# Patient Record
Sex: Male | Born: 1960 | Race: White | Hispanic: No | Marital: Married
Health system: Midwestern US, Academic
[De-identification: ages and names within clinical notes are randomized; demographics above are authoritative.]

---

## 2017-07-27 ENCOUNTER — Encounter: Admit: 2017-07-27 | Discharge: 2017-07-28 | Payer: MEDICARE

## 2017-07-28 ENCOUNTER — Emergency Department: Admit: 2017-07-28 | Discharge: 2017-07-28 | Payer: MEDICARE

## 2017-07-28 ENCOUNTER — Encounter: Admit: 2017-07-28 | Discharge: 2017-07-28 | Payer: MEDICARE

## 2017-07-28 ENCOUNTER — Ambulatory Visit: Admit: 2017-07-28 | Discharge: 2017-07-28 | Payer: MEDICARE

## 2017-07-28 DIAGNOSIS — Z794 Long term (current) use of insulin: ICD-10-CM

## 2017-07-28 DIAGNOSIS — S82891A Other fracture of right lower leg, initial encounter for closed fracture: Principal | ICD-10-CM

## 2017-07-28 DIAGNOSIS — E1140 Type 2 diabetes mellitus with diabetic neuropathy, unspecified: ICD-10-CM

## 2017-07-28 DIAGNOSIS — I10 Essential (primary) hypertension: ICD-10-CM

## 2017-07-28 DIAGNOSIS — G629 Polyneuropathy, unspecified: ICD-10-CM

## 2017-07-28 DIAGNOSIS — E119 Type 2 diabetes mellitus without complications: Principal | ICD-10-CM

## 2017-07-28 LAB — PTT (APTT): Lab: 29 s — ABNORMAL LOW (ref 24.0–36.5)

## 2017-07-28 LAB — COMPREHENSIVE METABOLIC PANEL
Lab: 0.7 mg/dL (ref 0.3–1.2)
Lab: 0.7 mg/dL (ref 0.4–1.24)
Lab: 10 mg/dL (ref 8.5–10.6)
Lab: 133 MMOL/L — ABNORMAL LOW (ref 137–147)
Lab: 14 K/UL — ABNORMAL HIGH (ref 3–12)
Lab: 16 U/L (ref 7–56)
Lab: 18 U/L (ref 7–40)
Lab: 183 U/L — ABNORMAL HIGH (ref 25–110)
Lab: 21 MMOL/L (ref 21–30)
Lab: 3.7 MMOL/L — ABNORMAL LOW (ref 3.5–5.1)
Lab: 32 mg/dL — ABNORMAL HIGH (ref 7–25)
Lab: 4.2 g/dL (ref 3.5–5.0)
Lab: 8.2 g/dL — ABNORMAL HIGH (ref 6.0–8.0)

## 2017-07-28 LAB — PROTIME INR (PT): Lab: 1.1 mg/dL — ABNORMAL HIGH (ref 0.8–1.2)

## 2017-07-28 LAB — CBC AND DIFF: Lab: 13 10*3/uL — ABNORMAL HIGH (ref 4.5–11.0)

## 2017-07-28 LAB — POC GLUCOSE
Lab: 245 mg/dL — ABNORMAL HIGH (ref 70–100)
Lab: 201 mg/dL — ABNORMAL HIGH (ref 70–100)

## 2017-07-28 MED ORDER — SUCCINYLCHOLINE CHLORIDE 20 MG/ML IJ SOLN
INTRAVENOUS | 0 refills | Status: DC
Start: 2017-07-28 — End: 2017-07-28
  Administered 2017-07-28: 17:00:00 120 mg via INTRAVENOUS

## 2017-07-28 MED ORDER — ONDANSETRON HCL (PF) 4 MG/2 ML IJ SOLN
INTRAVENOUS | 0 refills | Status: DC
Start: 2017-07-28 — End: 2017-07-28
  Administered 2017-07-28: 17:00:00 4 mg via INTRAVENOUS

## 2017-07-28 MED ORDER — PHENYLEPHRINE IN 0.9% NACL(PF) 1 MG/10 ML (100 MCG/ML) IV SYRG
INTRAVENOUS | 0 refills | Status: DC
Start: 2017-07-28 — End: 2017-07-28
  Administered 2017-07-28: 17:00:00 100 ug via INTRAVENOUS

## 2017-07-28 MED ORDER — PROMETHAZINE 25 MG/ML IJ SOLN
6.25 mg | INTRAVENOUS | 0 refills | Status: DC | PRN
Start: 2017-07-28 — End: 2017-07-28

## 2017-07-28 MED ORDER — ENOXAPARIN 40 MG/0.4 ML SC SYRG
40 mg | Freq: Every day | SUBCUTANEOUS | 0 refills | 7.00000 days | Status: AC
Start: 2017-07-28 — End: 2017-08-22
  Filled 2017-07-28 (×2): qty 8, 21d supply, fill #1

## 2017-07-28 MED ORDER — OXYCODONE 5 MG PO TAB
5-10 mg | ORAL_TABLET | ORAL | 0 refills | 6.00000 days | Status: AC | PRN
Start: 2017-07-28 — End: 2017-08-22
  Filled 2017-07-28 (×2): qty 40, 10d supply, fill #1

## 2017-07-28 MED ORDER — LIDOCAINE (PF) 200 MG/10 ML (2 %) IJ SYRG
0 refills | Status: DC
Start: 2017-07-28 — End: 2017-07-28
  Administered 2017-07-28: 17:00:00 100 mg via INTRAVENOUS

## 2017-07-28 MED ORDER — METOCLOPRAMIDE HCL 5 MG/ML IJ SOLN
10 mg | Freq: Once | INTRAVENOUS | 0 refills | Status: DC | PRN
Start: 2017-07-28 — End: 2017-07-28

## 2017-07-28 MED ORDER — CEFAZOLIN 1 GRAM IJ SOLR
0 refills | Status: DC
Start: 2017-07-28 — End: 2017-07-28
  Administered 2017-07-28: 17:00:00 2 g via INTRAVENOUS

## 2017-07-28 MED ORDER — OXYCODONE 5 MG PO TAB
5 mg | Freq: Once | ORAL | 0 refills | Status: CP
Start: 2017-07-28 — End: ?
  Administered 2017-07-28: 19:00:00 5 mg via ORAL

## 2017-07-28 MED ORDER — ATENOLOL 100 MG PO TAB
100 mg | Freq: Once | ORAL | 0 refills | Status: CP
Start: 2017-07-28 — End: ?
  Administered 2017-07-28: 17:00:00 100 mg via ORAL

## 2017-07-28 MED ORDER — DEXTRAN 70-HYPROMELLOSE (PF) 0.1-0.3 % OP DPET
0 refills | Status: DC
Start: 2017-07-28 — End: 2017-07-28
  Administered 2017-07-28: 17:00:00 2 [drp] via OPHTHALMIC

## 2017-07-28 MED ORDER — PROPOFOL INJ 10 MG/ML IV VIAL
0 refills | Status: DC
Start: 2017-07-28 — End: 2017-07-28
  Administered 2017-07-28: 17:00:00 200 mg via INTRAVENOUS

## 2017-07-28 MED ORDER — DEXAMETHASONE SODIUM PHOSPHATE 4 MG/ML IJ SOLN
INTRAVENOUS | 0 refills | Status: DC
Start: 2017-07-28 — End: 2017-07-28
  Administered 2017-07-28: 17:00:00 4 mg via INTRAVENOUS

## 2017-07-28 MED ORDER — ROCURONIUM 10 MG/ML IV SOLN
INTRAVENOUS | 0 refills | Status: DC
Start: 2017-07-28 — End: 2017-07-28
  Administered 2017-07-28: 17:00:00 30 mg via INTRAVENOUS

## 2017-07-28 MED ORDER — HYDROMORPHONE (PF) 2 MG/ML IJ SYRG
0 refills | Status: DC
Start: 2017-07-28 — End: 2017-07-28
  Administered 2017-07-28 (×2): 0.5 mg via INTRAVENOUS

## 2017-07-28 MED ORDER — MIDAZOLAM 1 MG/ML IJ SOLN
INTRAVENOUS | 0 refills | Status: DC
Start: 2017-07-28 — End: 2017-07-28
  Administered 2017-07-28: 17:00:00 2 mg via INTRAVENOUS

## 2017-07-28 MED ORDER — DIPHENHYDRAMINE HCL 50 MG/ML IJ SOLN
25 mg | Freq: Once | INTRAVENOUS | 0 refills | Status: DC | PRN
Start: 2017-07-28 — End: 2017-07-28

## 2017-07-28 MED ORDER — LACTATED RINGERS IV SOLP
1000 mL | INTRAVENOUS | 0 refills | Status: DC
Start: 2017-07-28 — End: 2017-07-28
  Administered 2017-07-28: 16:00:00 1000 mL via INTRAVENOUS

## 2017-07-28 NOTE — ED Notes
Pt belongings include:    Statisticianweatpants  Sweatshirt  Glasses    Belongings in labeled bag at bedside. Wife will keep wallet and cell phone. Declines the need to document.

## 2017-07-28 NOTE — ED Notes
Pt presents to the ED with his wife. Reports he fractured his left ankle on Thanksgiving after a fall, and had surgery. Now in a walking boot. Pt states that he continued to have right ankle pain as well. Had x-rays performed at an outside facility and arrives with splint to his right ankle. Told he has right ankle fracture and dislocation. Told to come in last night, but states he had already eaten, so figured they wouldn't do surgery. Presents this AM for ortho eval. States no pain while sitting. Has been ambulating on ankle at home, and rates pain 4/10 when he is up. Dr Gaylyn Cheerseinhardt currently at bedside seeing pt. Pt is alert and able to answer questions appropriately. Wife at bedside.

## 2017-07-28 NOTE — ED Notes
Pt remains in x-ray.

## 2017-07-28 NOTE — Other
Brief Operative Note    Name: Lance Thompson is a 56 y.o. male     DOB: 1961-07-27             MRN#: [id number]  DATE OF OPERATION: 07/28/2017    Date:  07/28/2017        Preoperative Dx:   Closed fracture of right ankle, initial encounter [S82.891A]    Post-op Diagnosis      * Closed fracture of right ankle, initial encounter [S82.891A]    Procedure(s) (LRB):  APPLICATION EXTERNAL FIXATION SYSTEM MULTIPLANE - LOWER EXTREMITY (Right)    Anesthesia Type: Defer to Anesthesia    Surgeon(s) and Role:     Mare Ferrari* Burton, Douglas, Lance Thompson - Primary     * Priscille LovelessStumpff, Kelly, Lance Thompson - Resident - Assisting     * Lance Burkitteinhardt, Lance Wik, Lance Thompson - Resident - Assisting      Findings:  Ankle fracture dislocation    Estimated Blood Loss: No blood loss documented.     Specimen(s) Removed/Disposition: * No specimens in log *    Complications:  None    Implants: see dictated note    Drains: None    Disposition:  PACU - stable    Lance Burkittaniel Alaijah Gibler, Lance Thompson  Pager

## 2017-07-28 NOTE — Anesthesia Post-Procedure Evaluation
Post-Anesthesia Evaluation    Name: Lance Thompson      MRN: [id number]     DOB: March 27, 1961     Age: 56 y.o.     Sex: male   __________________________________________________________________________     Procedure Date: 07/28/2017  Procedure: Procedure(s):  APPLICATION EXTERNAL FIXATION SYSTEM MULTIPLANE - LOWER EXTREMITY      Surgeon: Surgeon(s):  Priscille LovelessStumpff, Kelly, MD  Casey Burkitteinhardt, Daniel, MD  Mare FerrariBurton, Douglas, MD    Post-Anesthesia Vitals  BP: 128/70 (12/29 1315)  Temp: 36.6 C (97.9 F) (12/29 1315)  Pulse: 80 (12/29 1315)  Respirations: 11 PER MINUTE (12/29 1315)  SpO2: 95 % (12/29 1315)  O2 Delivery: None (Room Air) (12/29 1315)  SpO2 Pulse: 82 (12/29 1315)      Post Anesthesia Evaluation Note    Evaluation location: Pre/Post  Patient participation: recovered; patient participated in evaluation  Level of consciousness: alert    Pain score: 0  Pain management: adequate    Hydration: normovolemia  Temperature: 36.0C - 38.4C  Airway patency: adequate    Perioperative Events      Postoperative Status  Cardiovascular status: hemodynamically stable  Respiratory status: spontaneous ventilation        Perioperative Events  Perioperative Event: No  Emergency Case Activation: No

## 2017-07-28 NOTE — H&P (View-Only)
[facility] Note        Admission Date: 07/28/2017                                                  Chief Complaint/Reason for Consult: Right ankle fracture dislocation    Assessment/Plan   Lance Thompson is a 56 y.o. male with a right ankle fracture dislocation    Operative Intervention: Or today for external fixator application  WB status: Nonweightbearing right lower extremity  Antibiotics/tetanus: None from ortho standpoint  Pain Control: Oral  Keep n.p.o.  Hold all anticoagulation      Gaylyn Cheers  [id number]      ______________________________________________________________________    History of Present Illness: Lance Thompson is a 56 y.o. male   With diabetes and hypertension who presents with a approximately 57-week old ankle fracture dislocation.  Patient fell off a porch around Thanksgiving.  He was diagnosed and treated for a left ankle fracture to include surgery.  He had continued right ankle pain but was still ambulating on it.  This prompted x-rays from a local orthopedic surgeon yesterday and he was diagnosed with subacute ankle fracture dislocation.  Patient states that he has some pain with ambulating but none at rest.  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  He is insulin-dependent diabetic.  He has no history of myocardial infarction or stroke.  He has no history of chest pain.           No past medical history on file.  No past surgical history on file.    Social History  None tobacco use  None illicit drug use  None alcohol use    Family history  No pertinent family history  Family history is otherwise noncontributory for presenting problem    Allergies:  Fentanyl and Benicar [olmesartan]    No current outpatient medications on file as of 07/28/2017.         Review of Systems:  Pertinent positive findings: Pain to right lower extremity  Pertinent negative findings: Denies changes to baseline numbness  Otherwise a 10 point review of systems was negative. Vital Signs:  Last Filed in 24 hours   BP: 143/73 (12/29 0931)  Temp: 36.5 ???C (97.7 ???F) (12/29 1610)  Respirations: 18 PER MINUTE (12/29 0918)  SpO2: 97 % (12/29 0931)  SpO2 Pulse: 94 (12/29 0931)  Height: 175.3 cm (69) (12/29 9604)     Physical Exam:    Constitutional: No acute distress  Eyes: EOM grossly intact  ENT: Oropharynx/Nasopharynx clear  Respiratory: Unlabored respirations  Cardiovascular: Rate normal  Gastrointestinal: No obvious distention  Skin: Intact, swollen and unable to wrinkle skin of right lower extremity  Musculoskeletal: Flexes and extends great toe of right lower extremity  Neurologic: Sensation intact grossly to light touch throughout right lower extremity    Lab/Radiology/Other Diagnostic Tests:    Radiology: Reviewed, right ankle fracture dislocation    CBC w/Diff   Lab Results   Component Value Date/Time    WBC 13.9 (H) 07/28/2017 09:10 AM    HGB 12.5 (L) 07/28/2017 09:10 AM    HCT 37.3 (L) 07/28/2017 09:10 AM    PLTCT 417 (H) 07/28/2017 09:10 AM           Basic Metabolic Profile   Lab Results   Component Value Date/Time  NA 133 (L) 07/28/2017 09:10 AM    K 3.7 07/28/2017 09:10 AM    CL 98 07/28/2017 09:10 AM    CO2 21 07/28/2017 09:10 AM    GAP 14 (H) 07/28/2017 09:10 AM    BUN 32 (H) 07/28/2017 09:10 AM    CR 0.79 07/28/2017 09:10 AM    GLU 267 (H) 07/28/2017 09:10 AM        Coagulation Studies   Lab Results   Component Value Date/Time    PTT 29.2 07/28/2017 09:10 AM    INR 1.1 07/28/2017 09:10 AM            Casey Burkitt, MD  [phone]

## 2017-07-28 NOTE — ED Notes
Report given to Nicole, RN.

## 2017-07-29 NOTE — Operative Report(Direct Entry)
OPERATIVE REPORT    Name: PRASHANT GLOSSER is a 56 y.o. male     DOB: [birth year]             MRN#: [id number]    DATE OF OPERATION: 07/28/2017    Surgeon(s) and Role:     Mare Ferrari, MD - Primary     * Priscille Loveless, MD - Resident - Assisting     * Casey Burkitt, MD - Resident - Assisting        Preoperative Diagnosis:    Closed fracture of right ankle, initial encounter [S82.891A]    Post-op Diagnosis      * Closed fracture of right ankle, initial encounter [S82.891A]    Procedure(s) (LRB):  APPLICATION EXTERNAL FIXATION SYSTEM MULTIPLANE - LOWER EXTREMITY (Right)    Anesthesia Type: Defer to Anesthesia    Description and Findings of Operative Procedure:   Indications: Patient is a 56 year old male who has a history of bilateral ankle fractures that he sustained on Thanksgiving when he stepped off a porch.  His left lower extremity had previously been treated with operative fixation.  His right ankle fracture was only discovered in the previous days as he complained about continued pain.  X-rays were obtained that demonstrated a fracture dislocation of his ankle.  Patient had been ambulating on this side for over a month.  He presented to an outside provider and then was referred to [facility] for management of this injury.  He was counseled on treatment options and elected to proceed with surgical treatment.  He understood that this was a very of serious injury especially considering his diabetes and could result in a below-knee amputation if his wounds do not heal.  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  All questions were answered.    The patient was transported to the operative suite.  He was anesthetized and we prepped and draped in the usual sterile fashion.  A timeout was performed.  Imaging was brought in.  2 external fixator pins were placed in the midshaft of the anterior medial tibia.  These were placed with the use of a scalpel to incise skin.  Imaging confirmed that these pins were placed bicortically.  We then turned our attention to the calcaneus.  A single pin was placed through the calcaneus with the use of imaging.  The skin was incised with a knife and hemostat was used to spread subcutaneous and the neurovascular tissues out of the way prior to pin placement.  Traction and a reduction maneuver were then performed.  Imaging confirmed that the ankle alignment was significantly improved with just slight anterior subluxation of the talus on the tibia.  The external fixator bars were then applied and tightened in a delta configuration.  Wounds were dressed with Xeroform and Kling.  Patient was awoken from anesthesia.  He tolerated the procedure well.    Estimated Blood Loss: Minimal    Specimen(s) Removed/Disposition: * No specimens in log *    Attestation: I was present for the entire procedure.    Casey Burkitt, MD  Pager

## 2017-08-01 ENCOUNTER — Encounter: Admit: 2017-08-01 | Discharge: 2017-08-01 | Payer: MEDICARE

## 2017-08-01 DIAGNOSIS — G629 Polyneuropathy, unspecified: ICD-10-CM

## 2017-08-01 DIAGNOSIS — I10 Essential (primary) hypertension: ICD-10-CM

## 2017-08-01 DIAGNOSIS — E119 Type 2 diabetes mellitus without complications: Principal | ICD-10-CM

## 2017-08-03 ENCOUNTER — Encounter: Admit: 2017-08-03 | Discharge: 2017-08-03 | Payer: MEDICARE

## 2017-08-03 DIAGNOSIS — R52 Pain, unspecified: Principal | ICD-10-CM

## 2017-08-05 ENCOUNTER — Encounter: Admit: 2017-08-05 | Discharge: 2017-08-05 | Payer: MEDICARE

## 2017-08-05 ENCOUNTER — Encounter: Admit: 2017-08-05 | Discharge: 2017-08-06 | Payer: MEDICARE

## 2017-08-05 DIAGNOSIS — M86271 Subacute osteomyelitis, right ankle and foot: ICD-10-CM

## 2017-08-05 DIAGNOSIS — R69 Illness, unspecified: ICD-10-CM

## 2017-08-05 DIAGNOSIS — T847XXA Infection and inflammatory reaction due to other internal orthopedic prosthetic devices, implants and grafts, initial encounter: Principal | ICD-10-CM

## 2017-08-05 DIAGNOSIS — T847XXD Infection and inflammatory reaction due to other internal orthopedic prosthetic devices, implants and grafts, subsequent encounter: ICD-10-CM

## 2017-08-05 MED ORDER — SODIUM CHLORIDE 0.9 % IV SOLP
INTRAVENOUS | 0 refills | Status: DC
Start: 2017-08-05 — End: 2017-08-07
  Administered 2017-08-06 – 2017-08-07 (×2): 1000.000 mL via INTRAVENOUS

## 2017-08-05 MED ORDER — VANCOMYCIN 1,500 MG IVPB
1500 mg | Freq: Two times a day (BID) | INTRAVENOUS | 0 refills | Status: DC
Start: 2017-08-05 — End: 2017-08-07
  Administered 2017-08-06 – 2017-08-07 (×8): 1500 mg via INTRAVENOUS

## 2017-08-05 MED ORDER — VANCOMYCIN PHARMACY TO MANAGE
1 | 0 refills | Status: DC
Start: 2017-08-05 — End: 2017-08-07

## 2017-08-05 MED ORDER — MAGNESIUM HYDROXIDE 2,400 MG/10 ML PO SUSP
10 mL | Freq: Every day | ORAL | 0 refills | Status: DC
Start: 2017-08-05 — End: 2017-08-22
  Administered 2017-08-07 – 2017-08-22 (×6): 10 mL via ORAL

## 2017-08-05 MED ORDER — ENOXAPARIN 40 MG/0.4 ML SC SYRG
40 mg | Freq: Every day | SUBCUTANEOUS | 0 refills | Status: DC
Start: 2017-08-05 — End: 2017-08-22
  Administered 2017-08-07 – 2017-08-22 (×15): 40 mg via SUBCUTANEOUS

## 2017-08-05 MED ORDER — VANCOMYCIN IVPB
15 mg/kg | INTRAVENOUS | 0 refills | Status: DC
Start: 2017-08-05 — End: 2017-08-06

## 2017-08-05 MED ORDER — OXYCODONE 5 MG PO TAB
5-10 mg | ORAL | 0 refills | Status: DC | PRN
Start: 2017-08-05 — End: 2017-08-22
  Administered 2017-08-06 – 2017-08-15 (×17): 10 mg via ORAL
  Administered 2017-08-15: 5 mg via ORAL
  Administered 2017-08-16: 04:00:00 10 mg via ORAL
  Administered 2017-08-17: 05:00:00 5 mg via ORAL
  Administered 2017-08-17: 16:00:00 10 mg via ORAL
  Administered 2017-08-17: 03:00:00 5 mg via ORAL
  Administered 2017-08-18 – 2017-08-20 (×5): 10 mg via ORAL
  Administered 2017-08-20 (×2): 5 mg via ORAL
  Administered 2017-08-21: 04:00:00 10 mg via ORAL

## 2017-08-05 MED ORDER — PIPERACILLIN/TAZOBACTAM 3.375 G/NS IVPB (MB+)
3.375 g | INTRAVENOUS | 0 refills | Status: DC
Start: 2017-08-05 — End: 2017-08-06

## 2017-08-05 MED ORDER — BISACODYL 10 MG RE SUPP
10 mg | Freq: Every day | RECTAL | 0 refills | Status: DC | PRN
Start: 2017-08-05 — End: 2017-08-21
  Administered 2017-08-06: 06:00:00 10 mg via RECTAL

## 2017-08-05 MED ORDER — PIPERACILLIN/TAZOBACTAM 3.375 G/NS IVPB (MB+)
3.375 g | INTRAVENOUS | 0 refills | Status: DC
Start: 2017-08-05 — End: 2017-08-15
  Administered 2017-08-06 – 2017-08-15 (×73): 3.375 g via INTRAVENOUS

## 2017-08-05 MED ORDER — ONDANSETRON HCL (PF) 4 MG/2 ML IJ SOLN
4 mg | INTRAVENOUS | 0 refills | Status: DC | PRN
Start: 2017-08-05 — End: 2017-08-22
  Administered 2017-08-06 – 2017-08-21 (×4): 4 mg via INTRAVENOUS

## 2017-08-05 MED ORDER — POTASSIUM CHLORIDE 20 MEQ PO TBTQ
60 meq | Freq: Once | ORAL | 0 refills | Status: CP
Start: 2017-08-05 — End: ?
  Administered 2017-08-06: 05:00:00 60 meq via ORAL

## 2017-08-05 MED ORDER — INSULIN ASPART 100 UNIT/ML SC FLEXPEN
0-7 [IU] | Freq: Before meals | SUBCUTANEOUS | 0 refills | Status: DC
Start: 2017-08-05 — End: 2017-08-14
  Administered 2017-08-06: 19:00:00 2 [IU] via SUBCUTANEOUS
  Administered 2017-08-06: 14:00:00 1 [IU] via SUBCUTANEOUS

## 2017-08-05 MED ORDER — VANCOMYCIN 1,500 MG IVPB
1500 mg | Freq: Once | INTRAVENOUS | 0 refills | Status: DC
Start: 2017-08-05 — End: 2017-08-06

## 2017-08-06 ENCOUNTER — Encounter: Admit: 2017-08-06 | Discharge: 2017-08-06 | Payer: MEDICARE

## 2017-08-06 ENCOUNTER — Inpatient Hospital Stay
Admit: 2017-08-06 | Discharge: 2017-08-22 | Disposition: A | Discharge status: Skilled Nursing Facility | Payer: MEDICARE | Source: Other Acute Inpatient Hospital | Admitting: Hand Surgery

## 2017-08-06 DIAGNOSIS — E119 Type 2 diabetes mellitus without complications: Principal | ICD-10-CM

## 2017-08-06 DIAGNOSIS — T847XXA Infection and inflammatory reaction due to other internal orthopedic prosthetic devices, implants and grafts, initial encounter: Principal | ICD-10-CM

## 2017-08-06 DIAGNOSIS — G629 Polyneuropathy, unspecified: ICD-10-CM

## 2017-08-06 DIAGNOSIS — I10 Essential (primary) hypertension: ICD-10-CM

## 2017-08-06 LAB — POC GLUCOSE
Lab: 99 mg/dL — ABNORMAL LOW (ref 70–100)
Lab: 99 mg/dL (ref 70–100)
Lab: 161 mg/dL — ABNORMAL HIGH (ref 70–100)
Lab: 225 mg/dL — ABNORMAL HIGH (ref 70–100)
Lab: 167 mg/dL — ABNORMAL HIGH (ref 70–100)
Lab: 166 mg/dL — ABNORMAL HIGH (ref 70–100)

## 2017-08-06 LAB — BASIC METABOLIC PANEL
Lab: 0.9 mg/dL (ref 0.4–1.24)
Lab: 10 pg (ref 3–12)
Lab: 103 MMOL/L — ABNORMAL LOW (ref 98–110)
Lab: 133 MMOL/L — ABNORMAL LOW (ref 137–147)
Lab: 20 MMOL/L — ABNORMAL LOW (ref 21–30)
Lab: 3 MMOL/L — ABNORMAL LOW (ref 3.5–5.1)
Lab: 31 mg/dL — ABNORMAL HIGH (ref 7–25)
Lab: 8.9 mg/dL (ref 8.5–10.6)
Lab: 96 mg/dL (ref 70–100)
Lab: >60 mL/min (ref >60)
Lab: 134 MMOL/L — ABNORMAL LOW (ref >60)
Lab: 135 MMOL/L — ABNORMAL LOW (ref 137–147)

## 2017-08-06 LAB — GRAM STAIN

## 2017-08-06 LAB — LACTIC ACID (BG - RAPID LACTATE): Lab: 1.1 MMOL/L (ref 0.5–2.0)

## 2017-08-06 LAB — CBC AND DIFF
Lab: 19 10*3/uL — ABNORMAL HIGH (ref 4.5–11.0)
Lab: 17 K/UL — ABNORMAL HIGH (ref 4.5–11.0)
Lab: 9.4 g/dL — ABNORMAL LOW (ref >60)

## 2017-08-06 LAB — CBC
Lab: 17 10*3/uL — ABNORMAL HIGH (ref 4.5–11.0)
Lab: 3.4 M/UL — ABNORMAL LOW (ref 4.4–5.5)

## 2017-08-06 MED ORDER — LIDOCAINE (PF) 10 MG/ML (1 %) IJ SOLN
.1-2 mL | INTRAMUSCULAR | 0 refills | Status: DC | PRN
Start: 2017-08-06 — End: 2017-08-06

## 2017-08-06 MED ORDER — MORPHINE 2 MG/ML IV CRTG
1 mg | INTRAVENOUS | 0 refills | Status: DC | PRN
Start: 2017-08-06 — End: 2017-08-06

## 2017-08-06 MED ORDER — LACTATED RINGERS IV SOLP
1000 mL | INTRAVENOUS | 0 refills | Status: DC
Start: 2017-08-06 — End: 2017-08-06
  Administered 2017-08-06: 19:00:00 1000.000 mL via INTRAVENOUS

## 2017-08-06 MED ORDER — ATENOLOL 100 MG PO TAB
100 mg | Freq: Every day | ORAL | 0 refills | Status: CP
Start: 2017-08-06 — End: ?
  Administered 2017-08-06: 18:00:00 100 mg via ORAL

## 2017-08-06 MED ORDER — METOCLOPRAMIDE HCL 5 MG/ML IJ SOLN
10 mg | Freq: Once | INTRAVENOUS | 0 refills | Status: DC | PRN
Start: 2017-08-06 — End: 2017-08-06

## 2017-08-06 MED ORDER — LIDOCAINE (PF) 200 MG/10 ML (2 %) IJ SYRG
0 refills | Status: DC
Start: 2017-08-06 — End: 2017-08-06
  Administered 2017-08-06: 20:00:00 80 mg via INTRAVENOUS

## 2017-08-06 MED ORDER — GABAPENTIN 300 MG PO CAP
300 mg | Freq: Once | ORAL | 0 refills | Status: CP
Start: 2017-08-06 — End: ?
  Administered 2017-08-06: 18:00:00 300 mg via ORAL

## 2017-08-06 MED ORDER — HYDROMORPHONE 2 MG/ML IJ SOLN
0 refills | Status: DC
Start: 2017-08-06 — End: 2017-08-06
  Administered 2017-08-06 (×4): 0.5 mg via INTRAVENOUS

## 2017-08-06 MED ORDER — ONDANSETRON HCL (PF) 4 MG/2 ML IJ SOLN
INTRAVENOUS | 0 refills | Status: DC
Start: 2017-08-06 — End: 2017-08-06
  Administered 2017-08-06: 20:00:00 4 mg via INTRAVENOUS

## 2017-08-06 MED ORDER — DEXTRAN 70-HYPROMELLOSE (PF) 0.1-0.3 % OP DPET
0 refills | Status: DC
Start: 2017-08-06 — End: 2017-08-06
  Administered 2017-08-06: 20:00:00 2 [drp] via OPHTHALMIC

## 2017-08-06 MED ORDER — DIPHENHYDRAMINE HCL 50 MG/ML IJ SOLN
25 mg | Freq: Once | INTRAVENOUS | 0 refills | Status: DC | PRN
Start: 2017-08-06 — End: 2017-08-06

## 2017-08-06 MED ORDER — ACETAMINOPHEN 500 MG PO TAB
1000 mg | Freq: Once | ORAL | 0 refills | Status: CP
Start: 2017-08-06 — End: ?
  Administered 2017-08-06: 18:00:00 1000 mg via ORAL

## 2017-08-06 MED ORDER — PHENYLEPHRINE IN 0.9% NACL(PF) 1 MG/10 ML (100 MCG/ML) IV SYRG
INTRAVENOUS | 0 refills | Status: DC
Start: 2017-08-06 — End: 2017-08-06
  Administered 2017-08-06 (×4): 100 ug via INTRAVENOUS

## 2017-08-06 MED ORDER — PROMETHAZINE 25 MG/ML IJ SOLN
6.25 mg | INTRAVENOUS | 0 refills | Status: DC | PRN
Start: 2017-08-06 — End: 2017-08-06

## 2017-08-06 MED ORDER — HYDROMORPHONE (PF) 2 MG/ML IJ SYRG
.5-1 mg | INTRAVENOUS | 0 refills | Status: DC | PRN
Start: 2017-08-06 — End: 2017-08-06

## 2017-08-06 MED ORDER — SODIUM CHLORIDE 0.9 % IR SOLN
0 refills | Status: DC
Start: 2017-08-06 — End: 2017-08-06
  Administered 2017-08-06: 20:00:00 1000 mL

## 2017-08-06 MED ORDER — PROPOFOL INJ 10 MG/ML IV VIAL
0 refills | Status: DC
Start: 2017-08-06 — End: 2017-08-06
  Administered 2017-08-06: 20:00:00 170 mg via INTRAVENOUS

## 2017-08-06 MED ORDER — MORPHINE 2 MG/ML IV CRTG
2 mg | INTRAVENOUS | 0 refills | Status: DC | PRN
Start: 2017-08-06 — End: 2017-08-06

## 2017-08-07 ENCOUNTER — Encounter: Admit: 2017-08-07 | Discharge: 2017-08-07 | Payer: MEDICARE

## 2017-08-07 DIAGNOSIS — E119 Type 2 diabetes mellitus without complications: Principal | ICD-10-CM

## 2017-08-07 DIAGNOSIS — I10 Essential (primary) hypertension: ICD-10-CM

## 2017-08-07 DIAGNOSIS — G629 Polyneuropathy, unspecified: ICD-10-CM

## 2017-08-07 LAB — POC GLUCOSE
Lab: 176 mg/dL — ABNORMAL HIGH (ref 70–100)
Lab: 177 mg/dL — ABNORMAL HIGH (ref 70–100)
Lab: 198 mg/dL — ABNORMAL HIGH (ref 70–100)
Lab: 222 mg/dL — ABNORMAL HIGH (ref 70–100)
Lab: 271 mg/dL — ABNORMAL HIGH (ref 70–100)

## 2017-08-07 LAB — CBC AND DIFF: Lab: 19 K/UL — ABNORMAL HIGH (ref 4.5–11.0)

## 2017-08-07 LAB — VANCOMYCIN TROUGH: Lab: 19 ug/mL — ABNORMAL HIGH (ref 10.0–20.0)

## 2017-08-07 LAB — BASIC METABOLIC PANEL: Lab: 126 MMOL/L — ABNORMAL LOW (ref >60)

## 2017-08-07 MED ORDER — INSULIN GLARGINE 100 UNIT/ML (3 ML) SC INJ PEN
30 [IU] | Freq: Two times a day (BID) | SUBCUTANEOUS | 0 refills | Status: DC
Start: 2017-08-07 — End: 2017-08-22
  Administered 2017-08-08 – 2017-08-19 (×3): 30 [IU] via SUBCUTANEOUS

## 2017-08-07 MED ORDER — VENLAFAXINE 37.5 MG PO TAB
37.5 mg | Freq: Two times a day (BID) | ORAL | 0 refills | Status: DC
Start: 2017-08-07 — End: 2017-08-22
  Administered 2017-08-07 – 2017-08-22 (×28): 37.5 mg via ORAL

## 2017-08-08 ENCOUNTER — Encounter: Admit: 2017-08-08 | Discharge: 2017-08-08 | Payer: MEDICARE

## 2017-08-08 DIAGNOSIS — I10 Essential (primary) hypertension: ICD-10-CM

## 2017-08-08 DIAGNOSIS — G629 Polyneuropathy, unspecified: ICD-10-CM

## 2017-08-08 DIAGNOSIS — E119 Type 2 diabetes mellitus without complications: Principal | ICD-10-CM

## 2017-08-08 LAB — CBC AND DIFF: Lab: 16 K/UL — ABNORMAL HIGH (ref 4.5–11.0)

## 2017-08-08 LAB — BASIC METABOLIC PANEL
Lab: 130 MMOL/L — ABNORMAL LOW (ref 137–147)
Lab: 244 mg/dL — ABNORMAL HIGH (ref 70–100)
Lab: 26 MMOL/L (ref 21–30)
Lab: 3.4 MMOL/L — ABNORMAL LOW (ref 3.5–5.1)
Lab: 97 MMOL/L — ABNORMAL LOW (ref 98–110)
Lab: 7 (ref 3–12)
Lab: 133 MMOL/L — ABNORMAL LOW (ref 137–147)

## 2017-08-08 LAB — POC GLUCOSE
Lab: 273 mg/dL — ABNORMAL HIGH (ref 70–100)
Lab: 84 mg/dL (ref 70–100)
Lab: 88 mg/dL (ref 70–100)

## 2017-08-08 LAB — OSMOLALITY: Lab: 282 mosm/kg — ABNORMAL LOW (ref 280–307)

## 2017-08-08 LAB — UREA NITROGEN-URINE RANDOM: Lab: 486 mg/dL

## 2017-08-08 LAB — CREATININE-URINE RANDOM: Lab: 48 mg/dL

## 2017-08-08 LAB — OSMOLALITY-URINE RANDOM: Lab: 384 mosm/kg (ref 50–1400)

## 2017-08-08 LAB — SODIUM-URINE RANDOM: Lab: 60 MMOL/L

## 2017-08-08 MED ORDER — MORPHINE 2 MG/ML IV CRTG
1-2 mg | Freq: Once | INTRAVENOUS | 0 refills | Status: AC
Start: 2017-08-08 — End: ?

## 2017-08-08 MED ORDER — POTASSIUM CHLORIDE 20 MEQ PO TBTQ
40 meq | Freq: Once | ORAL | 0 refills | Status: CP
Start: 2017-08-08 — End: ?
  Administered 2017-08-08: 17:00:00 40 meq via ORAL

## 2017-08-08 MED ADMIN — SODIUM CHLORIDE 0.9 % IV SOLP [27838]: INTRAVENOUS | @ 22:00:00 | Stop: 2017-08-08 | NDC 00338004904

## 2017-08-09 ENCOUNTER — Encounter: Admit: 2017-08-09 | Discharge: 2017-08-09 | Payer: MEDICARE

## 2017-08-09 ENCOUNTER — Inpatient Hospital Stay: Admit: 2017-08-09 | Discharge: 2017-08-09 | Payer: MEDICARE

## 2017-08-09 DIAGNOSIS — T847XXA Infection and inflammatory reaction due to other internal orthopedic prosthetic devices, implants and grafts, initial encounter: Principal | ICD-10-CM

## 2017-08-09 LAB — BASIC METABOLIC PANEL: Lab: 132 MMOL/L — ABNORMAL LOW (ref >60)

## 2017-08-09 LAB — POC GLUCOSE
Lab: 106 mg/dL — ABNORMAL HIGH (ref 70–100)
Lab: 144 mg/dL — ABNORMAL HIGH (ref 70–100)
Lab: 220 mg/dL — ABNORMAL HIGH (ref >60)
Lab: 221 mg/dL — ABNORMAL HIGH (ref 70–100)

## 2017-08-09 LAB — CULTURE-WOUND/TISSUE/FLUID(AEROBIC ONLY)W/SENSITIVITY

## 2017-08-09 LAB — CBC AND DIFF: Lab: 18 K/UL — ABNORMAL HIGH (ref 4.5–11.0)

## 2017-08-10 ENCOUNTER — Inpatient Hospital Stay: Admit: 2017-08-10 | Discharge: 2017-08-10 | Payer: MEDICARE

## 2017-08-10 ENCOUNTER — Encounter: Admit: 2017-08-10 | Discharge: 2017-08-10 | Payer: MEDICARE

## 2017-08-10 ENCOUNTER — Encounter: Admit: 2017-08-06 | Discharge: 2017-08-06 | Payer: MEDICARE

## 2017-08-10 DIAGNOSIS — G629 Polyneuropathy, unspecified: ICD-10-CM

## 2017-08-10 DIAGNOSIS — E119 Type 2 diabetes mellitus without complications: Principal | ICD-10-CM

## 2017-08-10 DIAGNOSIS — I10 Essential (primary) hypertension: ICD-10-CM

## 2017-08-10 LAB — CBC
Lab: 15 10*3/uL — ABNORMAL HIGH (ref 4.5–11.0)
Lab: 3.6 M/UL — ABNORMAL LOW (ref 4.4–5.5)
Lab: 550 10*3/uL — ABNORMAL HIGH (ref >60)
Lab: 7.6 FL (ref >60)

## 2017-08-10 LAB — POC GLUCOSE
Lab: 181 mg/dL — ABNORMAL HIGH (ref 70–100)
Lab: 84 mg/dL (ref 70–100)
Lab: 72 mg/dL (ref 70–100)
Lab: 110 mg/dL — ABNORMAL HIGH (ref 70–100)
Lab: 106 mg/dL — ABNORMAL HIGH (ref 70–100)
Lab: 120 mg/dL — ABNORMAL HIGH (ref 70–100)

## 2017-08-10 LAB — GRAM STAIN

## 2017-08-10 LAB — BASIC METABOLIC PANEL: Lab: 133 MMOL/L — ABNORMAL LOW (ref 137–147)

## 2017-08-10 MED ORDER — MORPHINE 2 MG/ML IV CRTG
1-2 mg | INTRAVENOUS | 0 refills | Status: DC | PRN
Start: 2017-08-10 — End: 2017-08-22
  Administered 2017-08-10: 21:00:00 2 mg via INTRAVENOUS

## 2017-08-10 MED ORDER — LIDOCAINE (PF) 200 MG/10 ML (2 %) IJ SYRG
0 refills | Status: DC
Start: 2017-08-10 — End: 2017-08-10
  Administered 2017-08-10: 17:00:00 100 mg via INTRAVENOUS

## 2017-08-10 MED ORDER — HYDROMORPHONE (PF) 2 MG/ML IJ SYRG
.5 mg | INTRAVENOUS | 0 refills | Status: DC | PRN
Start: 2017-08-10 — End: 2017-08-10
  Administered 2017-08-10: 20:00:00 0.5 mg via INTRAVENOUS

## 2017-08-10 MED ORDER — DEXTROSE 50 % IN WATER (D50W) IV SOLP
25 mL | Freq: Once | INTRAVENOUS | 0 refills | Status: CP
Start: 2017-08-10 — End: ?
  Administered 2017-08-10: 17:00:00 25 mL via INTRAVENOUS

## 2017-08-10 MED ORDER — VANCOMYCIN 1,000 MG IV SOLR
0 refills | Status: DC
Start: 2017-08-10 — End: 2017-08-10
  Administered 2017-08-10: 19:00:00 1 g via TOPICAL

## 2017-08-10 MED ORDER — ACETAMINOPHEN 500 MG PO TAB
1000 mg | Freq: Once | ORAL | 0 refills | Status: CP
Start: 2017-08-10 — End: ?
  Administered 2017-08-10: 20:00:00 1000 mg via ORAL

## 2017-08-10 MED ORDER — ONDANSETRON HCL (PF) 4 MG/2 ML IJ SOLN
INTRAVENOUS | 0 refills | Status: DC
Start: 2017-08-10 — End: 2017-08-10
  Administered 2017-08-10: 18:00:00 4 mg via INTRAVENOUS

## 2017-08-10 MED ORDER — MIDAZOLAM 1 MG/ML IJ SOLN
INTRAVENOUS | 0 refills | Status: DC
Start: 2017-08-10 — End: 2017-08-10
  Administered 2017-08-10: 17:00:00 2 mg via INTRAVENOUS

## 2017-08-10 MED ORDER — LACTATED RINGERS IV SOLP
INTRAVENOUS | 0 refills | Status: DC
Start: 2017-08-10 — End: 2017-08-12

## 2017-08-10 MED ORDER — LACTATED RINGERS IV SOLP
0 refills | Status: DC
Start: 2017-08-10 — End: 2017-08-10
  Administered 2017-08-10 (×2): via INTRAVENOUS

## 2017-08-10 MED ORDER — FENTANYL CITRATE (PF) 50 MCG/ML IJ SOLN
0 refills | Status: DC
Start: 2017-08-10 — End: 2017-08-10
  Administered 2017-08-10: 17:00:00 100 ug via INTRAVENOUS

## 2017-08-10 MED ORDER — ROCURONIUM 10 MG/ML IV SOLN
INTRAVENOUS | 0 refills | Status: DC
Start: 2017-08-10 — End: 2017-08-10
  Administered 2017-08-10: 17:00:00 50 mg via INTRAVENOUS

## 2017-08-10 MED ORDER — HALOPERIDOL LACTATE 5 MG/ML IJ SOLN
0 refills | Status: DC
Start: 2017-08-10 — End: 2017-08-10
  Administered 2017-08-10: 18:00:00 1 mg via INTRAVENOUS

## 2017-08-10 MED ORDER — PROPOFOL INJ 10 MG/ML IV VIAL
0 refills | Status: DC
Start: 2017-08-10 — End: 2017-08-10
  Administered 2017-08-10: 17:00:00 200 mg via INTRAVENOUS

## 2017-08-10 MED ORDER — SUGAMMADEX 100 MG/ML IV SOLN
INTRAVENOUS | 0 refills | Status: DC
Start: 2017-08-10 — End: 2017-08-10
  Administered 2017-08-10: 19:00:00 200 mg via INTRAVENOUS

## 2017-08-10 MED ORDER — CEFAZOLIN 1 GRAM IJ SOLR
0 refills | Status: DC
Start: 2017-08-10 — End: 2017-08-10
  Administered 2017-08-10: 18:00:00 2 g via INTRAVENOUS

## 2017-08-10 MED ORDER — PHENYLEPHRINE IN 0.9% NACL(PF) 1 MG/10 ML (100 MCG/ML) IV SYRG
INTRAVENOUS | 0 refills | Status: DC
Start: 2017-08-10 — End: 2017-08-10
  Administered 2017-08-10 (×6): 100 ug via INTRAVENOUS

## 2017-08-10 MED ADMIN — SODIUM CHLORIDE 0.9 % IV SOLP [27838]: 1000 mL | INTRAVENOUS | @ 21:00:00 | Stop: 2017-08-10 | NDC 00338004904

## 2017-08-10 MED ADMIN — LACTATED RINGERS IV SOLP [4318]: INTRAVENOUS | @ 16:00:00 | Stop: 2017-08-10 | NDC 00338011704

## 2017-08-11 LAB — CULTURE-BLOOD W/SENSITIVITY

## 2017-08-11 LAB — CULTURE-ANAEROBIC

## 2017-08-11 LAB — BASIC METABOLIC PANEL: Lab: 137 MMOL/L — ABNORMAL LOW (ref >60)

## 2017-08-11 LAB — POC GLUCOSE
Lab: 141 mg/dL — ABNORMAL HIGH (ref 70–100)
Lab: 143 mg/dL — ABNORMAL HIGH (ref 70–100)
Lab: 149 mg/dL — ABNORMAL HIGH (ref 70–100)
Lab: 84 mg/dL (ref 70–100)

## 2017-08-11 LAB — CBC: Lab: 16 K/UL — ABNORMAL HIGH (ref 4.5–11.0)

## 2017-08-12 LAB — POC GLUCOSE
Lab: 203 mg/dL — ABNORMAL HIGH (ref 70–100)
Lab: 75 mg/dL (ref 70–100)
Lab: 194 mg/dL — ABNORMAL HIGH (ref 70–100)
Lab: 127 mg/dL — ABNORMAL HIGH (ref 70–100)

## 2017-08-12 LAB — CBC: Lab: 16 K/UL — ABNORMAL HIGH (ref 4.5–11.0)

## 2017-08-12 LAB — BASIC METABOLIC PANEL: Lab: 136 MMOL/L — ABNORMAL LOW (ref 137–147)

## 2017-08-12 MED ORDER — ACETAMINOPHEN 325 MG PO TAB
650 mg | ORAL | 0 refills | Status: DC
Start: 2017-08-12 — End: 2017-08-22
  Administered 2017-08-13 – 2017-08-22 (×36): 650 mg via ORAL

## 2017-08-13 ENCOUNTER — Encounter: Admit: 2017-08-13 | Discharge: 2017-08-13 | Payer: MEDICARE

## 2017-08-13 DIAGNOSIS — G629 Polyneuropathy, unspecified: ICD-10-CM

## 2017-08-13 DIAGNOSIS — I10 Essential (primary) hypertension: ICD-10-CM

## 2017-08-13 DIAGNOSIS — E119 Type 2 diabetes mellitus without complications: Principal | ICD-10-CM

## 2017-08-13 LAB — CULTURE-BLOOD W/SENSITIVITY

## 2017-08-13 LAB — POC GLUCOSE
Lab: 119 mg/dL — ABNORMAL HIGH (ref 70–100)
Lab: 245 mg/dL — ABNORMAL HIGH (ref 70–100)
Lab: 258 mg/dL — ABNORMAL HIGH (ref 70–100)
Lab: 91 mg/dL (ref 70–100)
Lab: 92 mg/dL (ref 70–100)
Lab: 97 mg/dL (ref 70–100)

## 2017-08-13 LAB — BASIC METABOLIC PANEL: Lab: 133 MMOL/L — ABNORMAL LOW (ref >60)

## 2017-08-13 LAB — CBC: Lab: 14 K/UL — ABNORMAL HIGH (ref >60)

## 2017-08-13 MED ORDER — ATENOLOL 100 MG PO TAB
100 mg | Freq: Every day | ORAL | 0 refills | Status: DC
Start: 2017-08-13 — End: 2017-08-22
  Administered 2017-08-15 – 2017-08-22 (×8): 100 mg via ORAL

## 2017-08-13 MED ADMIN — LACTATED RINGERS IV SOLP [4318]: 1000 mL | INTRAVENOUS | Stop: 2017-08-13 | NDC 00338011704

## 2017-08-14 ENCOUNTER — Encounter: Admit: 2017-08-14 | Discharge: 2017-08-14 | Payer: MEDICARE

## 2017-08-14 LAB — BASIC METABOLIC PANEL
Lab: 0.9 mg/dL (ref 0.4–1.24)
Lab: 102 MMOL/L (ref 98–110)
Lab: 135 MMOL/L — ABNORMAL LOW (ref 137–147)
Lab: 17 mg/dL — ABNORMAL HIGH (ref 7–25)
Lab: 197 mg/dL — ABNORMAL HIGH (ref 70–100)
Lab: 26 MMOL/L (ref 21–30)
Lab: 4.4 MMOL/L — ABNORMAL LOW (ref 3.5–5.1)
Lab: 7 g/dL (ref 3–12)
Lab: 8.8 mg/dL (ref >60)
Lab: >60 mL/min (ref >60)
Lab: >60 mL/min (ref >60)

## 2017-08-14 LAB — GRAM STAIN

## 2017-08-14 LAB — POC GLUCOSE
Lab: 124 mg/dL — ABNORMAL HIGH (ref 70–100)
Lab: 239 mg/dL — ABNORMAL HIGH (ref 70–100)
Lab: 247 mg/dL — ABNORMAL HIGH (ref 70–100)
Lab: 182 mg/dL — ABNORMAL HIGH (ref 70–100)
Lab: 194 mg/dL — ABNORMAL HIGH (ref 70–100)

## 2017-08-14 LAB — CBC
Lab: 14 10*3/uL — ABNORMAL HIGH (ref 4.5–11.0)
Lab: 3.7 M/UL — ABNORMAL LOW (ref 4.4–5.5)

## 2017-08-14 MED ORDER — OXYCODONE 5 MG PO TAB
5-10 mg | Freq: Once | ORAL | 0 refills | Status: DC | PRN
Start: 2017-08-14 — End: 2017-08-14

## 2017-08-14 MED ORDER — LACTATED RINGERS IV SOLP
INTRAVENOUS | 0 refills | Status: DC
Start: 2017-08-14 — End: 2017-08-15
  Administered 2017-08-14: 18:00:00 1000.000 mL via INTRAVENOUS

## 2017-08-14 MED ORDER — MORPHINE 2 MG/ML IV CRTG
1 mg | INTRAVENOUS | 0 refills | Status: DC | PRN
Start: 2017-08-14 — End: 2017-08-14

## 2017-08-14 MED ORDER — ONDANSETRON HCL (PF) 4 MG/2 ML IJ SOLN
4 mg | Freq: Once | INTRAVENOUS | 0 refills | Status: DC | PRN
Start: 2017-08-14 — End: 2017-08-14

## 2017-08-14 MED ORDER — LACTATED RINGERS IV SOLP
INTRAVENOUS | 0 refills | Status: DC
Start: 2017-08-14 — End: 2017-08-14

## 2017-08-14 MED ORDER — GLYCOPYRROLATE 0.2 MG/ML IJ SOLN
0 refills | Status: DC
Start: 2017-08-14 — End: 2017-08-14
  Administered 2017-08-14: 14:00:00 0.2 mg via INTRAVENOUS

## 2017-08-14 MED ORDER — HYDROMORPHONE (PF) 2 MG/ML IJ SYRG
.5 mg | INTRAVENOUS | 0 refills | Status: DC | PRN
Start: 2017-08-14 — End: 2017-08-14

## 2017-08-14 MED ORDER — LIDOCAINE (PF) 10 MG/ML (1 %) IJ SOLN
.1-2 mL | INTRAMUSCULAR | 0 refills | Status: DC | PRN
Start: 2017-08-14 — End: 2017-08-14

## 2017-08-14 MED ORDER — DEXTRAN 70-HYPROMELLOSE (PF) 0.1-0.3 % OP DPET
0 refills | Status: DC
Start: 2017-08-14 — End: 2017-08-14
  Administered 2017-08-14: 14:00:00 2 [drp] via OPHTHALMIC

## 2017-08-14 MED ORDER — LACTATED RINGERS IV SOLP
1000 mL | INTRAVENOUS | 0 refills | Status: DC
Start: 2017-08-14 — End: 2017-08-14
  Administered 2017-08-14: 15:00:00 1000.000 mL via INTRAVENOUS

## 2017-08-14 MED ORDER — MIDAZOLAM 1 MG/ML IJ SOLN
INTRAVENOUS | 0 refills | Status: DC
Start: 2017-08-14 — End: 2017-08-14
  Administered 2017-08-14: 14:00:00 2 mg via INTRAVENOUS

## 2017-08-14 MED ORDER — PHENYLEPHRINE IN 0.9% NACL(PF) 1 MG/10 ML (100 MCG/ML) IV SYRG
INTRAVENOUS | 0 refills | Status: DC
Start: 2017-08-14 — End: 2017-08-14
  Administered 2017-08-14 (×5): 100 ug via INTRAVENOUS

## 2017-08-14 MED ORDER — ROCURONIUM 10 MG/ML IV SOLN
INTRAVENOUS | 0 refills | Status: DC
Start: 2017-08-14 — End: 2017-08-14
  Administered 2017-08-14: 14:00:00 50 mg via INTRAVENOUS

## 2017-08-14 MED ORDER — LIDOCAINE (PF) 200 MG/10 ML (2 %) IJ SYRG
0 refills | Status: DC
Start: 2017-08-14 — End: 2017-08-14
  Administered 2017-08-14: 14:00:00 100 mg via INTRAVENOUS

## 2017-08-14 MED ORDER — FENTANYL CITRATE (PF) 50 MCG/ML IJ SOLN
0 refills | Status: DC
Start: 2017-08-14 — End: 2017-08-14
  Administered 2017-08-14: 14:00:00 50 ug via INTRAVENOUS

## 2017-08-14 MED ORDER — SUGAMMADEX 100 MG/ML IV SOLN
INTRAVENOUS | 0 refills | Status: DC
Start: 2017-08-14 — End: 2017-08-14
  Administered 2017-08-14: 15:00:00 200 mg via INTRAVENOUS

## 2017-08-14 MED ORDER — INSULIN ASPART 100 UNIT/ML SC FLEXPEN
0-6 [IU] | Freq: Before meals | SUBCUTANEOUS | 0 refills | Status: DC
Start: 2017-08-14 — End: 2017-08-15

## 2017-08-14 MED ORDER — ONDANSETRON HCL (PF) 4 MG/2 ML IJ SOLN
INTRAVENOUS | 0 refills | Status: DC
Start: 2017-08-14 — End: 2017-08-14
  Administered 2017-08-14: 15:00:00 4 mg via INTRAVENOUS

## 2017-08-14 MED ORDER — PROPOFOL INJ 10 MG/ML IV VIAL
0 refills | Status: DC
Start: 2017-08-14 — End: 2017-08-14
  Administered 2017-08-14: 14:00:00 200 mg via INTRAVENOUS

## 2017-08-14 MED ORDER — HYDROMORPHONE (PF) 2 MG/ML IJ SYRG
1 mg | INTRAVENOUS | 0 refills | Status: DC | PRN
Start: 2017-08-14 — End: 2017-08-14

## 2017-08-14 MED ADMIN — LACTATED RINGERS IV SOLP [4318]: 1000 mL | INTRAVENOUS | @ 13:00:00 | Stop: 2017-08-14 | NDC 00338011704

## 2017-08-15 ENCOUNTER — Encounter: Admit: 2017-08-15 | Discharge: 2017-08-15 | Payer: MEDICARE

## 2017-08-15 LAB — CBC: Lab: 15 K/UL — ABNORMAL HIGH (ref 4.5–11.0)

## 2017-08-15 LAB — CULTURE-WOUND/TISSUE/FLUID(AEROBIC ONLY)W/SENSITIVITY

## 2017-08-15 LAB — BASIC METABOLIC PANEL: Lab: 135 MMOL/L — ABNORMAL LOW (ref >60)

## 2017-08-15 LAB — POC GLUCOSE
Lab: 178 mg/dL — ABNORMAL HIGH (ref 70–100)
Lab: 236 mg/dL — ABNORMAL HIGH (ref 70–100)
Lab: 243 mg/dL — ABNORMAL HIGH (ref 70–100)
Lab: 246 mg/dL — ABNORMAL HIGH (ref 70–100)
Lab: 247 mg/dL — ABNORMAL HIGH (ref 70–100)

## 2017-08-15 LAB — CULTURE-ANAEROBIC

## 2017-08-15 MED ORDER — ENOXAPARIN 40 MG/0.4 ML SC SYRG
40 mg | Freq: Every day | SUBCUTANEOUS | 0 refills | 7.00000 days | Status: AC
Start: 2017-08-15 — End: 2017-09-05

## 2017-08-15 MED ORDER — SENNOSIDES-DOCUSATE SODIUM 8.6-50 MG PO TAB
1 | Freq: Two times a day (BID) | ORAL | 0 refills | Status: SS
Start: 2017-08-15 — End: 2017-09-04

## 2017-08-15 MED ORDER — OXYCODONE 5 MG PO TAB
5-10 mg | ORAL_TABLET | ORAL | 0 refills | Status: SS | PRN
Start: 2017-08-15 — End: 2017-09-05

## 2017-08-15 MED ORDER — CEFAZOLIN INJ 1GM IVP
2 g | INTRAVENOUS | 0 refills | 8.00000 days | Status: AC
Start: 2017-08-15 — End: 2017-08-22

## 2017-08-15 MED ORDER — SODIUM CHLORIDE 0.9 % FLUSH
5-10 mL | Freq: Three times a day (TID) | INTRAVENOUS | 0 refills | Status: DC
Start: 2017-08-15 — End: 2017-08-22

## 2017-08-15 MED ORDER — CEFAZOLIN INJ 1GM IVP
2 g | INTRAVENOUS | 0 refills | Status: DC
Start: 2017-08-15 — End: 2017-08-22
  Administered 2017-08-15 – 2017-08-22 (×21): 2 g via INTRAVENOUS

## 2017-08-15 MED ORDER — ACETAMINOPHEN 325 MG PO TAB
650 mg | ORAL | 0 refills | Status: AC | PRN
Start: 2017-08-15 — End: ?

## 2017-08-15 MED ORDER — INSULIN ASPART 100 UNIT/ML SC FLEXPEN
0-12 [IU] | Freq: Before meals | SUBCUTANEOUS | 0 refills | Status: DC
Start: 2017-08-15 — End: 2017-08-22

## 2017-08-16 ENCOUNTER — Encounter: Admit: 2017-08-16 | Discharge: 2017-08-16 | Payer: MEDICARE

## 2017-08-16 DIAGNOSIS — G629 Polyneuropathy, unspecified: ICD-10-CM

## 2017-08-16 DIAGNOSIS — E119 Type 2 diabetes mellitus without complications: Principal | ICD-10-CM

## 2017-08-16 DIAGNOSIS — I10 Essential (primary) hypertension: ICD-10-CM

## 2017-08-16 LAB — BASIC METABOLIC PANEL
Lab: 132 MMOL/L — ABNORMAL LOW (ref 137–147)
Lab: 4 MMOL/L — ABNORMAL LOW (ref 3.5–5.1)

## 2017-08-16 LAB — POC GLUCOSE
Lab: 198 mg/dL — ABNORMAL HIGH (ref 70–100)
Lab: 140 mg/dL — ABNORMAL HIGH (ref 70–100)
Lab: 281 mg/dL — ABNORMAL HIGH (ref 70–100)
Lab: 298 mg/dL — ABNORMAL HIGH (ref 70–100)

## 2017-08-16 LAB — CBC: Lab: 15 10*3/uL — ABNORMAL HIGH (ref 4.5–11.0)

## 2017-08-16 MED ORDER — HYDRALAZINE 20 MG/ML IJ SOLN
10 mg | Freq: Once | INTRAVENOUS | 0 refills | Status: CP
Start: 2017-08-16 — End: ?
  Administered 2017-08-16: 10:00:00 10 mg via INTRAVENOUS

## 2017-08-17 LAB — BASIC METABOLIC PANEL
Lab: 0.6 mg/dL — ABNORMAL HIGH (ref 0.4–1.24)
Lab: 100 MMOL/L — ABNORMAL LOW (ref 98–110)
Lab: 132 MMOL/L — ABNORMAL LOW (ref 137–147)
Lab: 20 mg/dL (ref 7–25)
Lab: 222 mg/dL — ABNORMAL HIGH (ref 70–100)
Lab: 27 MMOL/L (ref 21–30)
Lab: 4.1 MMOL/L — ABNORMAL LOW (ref 3.5–5.1)
Lab: 9.1 mg/dL (ref 8.5–10.6)
Lab: >60 mL/min (ref >60)

## 2017-08-17 LAB — POC GLUCOSE
Lab: 259 mg/dL — ABNORMAL HIGH (ref 70–100)
Lab: 133 mg/dL — ABNORMAL HIGH (ref 70–100)
Lab: 266 mg/dL — ABNORMAL HIGH (ref 70–100)
Lab: 222 mg/dL — ABNORMAL HIGH (ref 70–100)

## 2017-08-17 LAB — CBC: Lab: 17 10*3/uL — ABNORMAL HIGH (ref 4.5–11.0)

## 2017-08-18 LAB — POC GLUCOSE
Lab: 255 mg/dL — ABNORMAL HIGH (ref 70–100)
Lab: 138 mg/dL — ABNORMAL HIGH (ref 70–100)
Lab: 247 mg/dL — ABNORMAL HIGH (ref 70–100)
Lab: 280 mg/dL — ABNORMAL HIGH (ref 70–100)

## 2017-08-18 LAB — BASIC METABOLIC PANEL
Lab: 131 MMOL/L — ABNORMAL LOW (ref >60)
Lab: 27 MMOL/L — ABNORMAL LOW (ref 21–30)
Lab: 4.1 MMOL/L — ABNORMAL LOW (ref >60)

## 2017-08-18 LAB — CBC
Lab: 17 K/UL — ABNORMAL HIGH (ref 4.5–11.0)
Lab: 3.6 M/UL — ABNORMAL LOW (ref 4.4–5.5)

## 2017-08-19 LAB — CULTURE-WOUND/TISSUE/FLUID(AEROBIC ONLY)W/SENSITIVITY

## 2017-08-19 LAB — POC GLUCOSE
Lab: 127 mg/dL — ABNORMAL HIGH (ref 70–100)
Lab: 169 mg/dL — ABNORMAL HIGH (ref 70–100)
Lab: 244 mg/dL — ABNORMAL HIGH (ref 70–100)
Lab: 298 mg/dL — ABNORMAL HIGH (ref 70–100)
Lab: 298 mg/dL — ABNORMAL HIGH (ref 70–100)

## 2017-08-19 LAB — CBC: Lab: 15 10*3/uL — ABNORMAL HIGH (ref 4.5–11.0)

## 2017-08-19 LAB — BASIC METABOLIC PANEL: Lab: 134 MMOL/L — ABNORMAL LOW (ref 137–147)

## 2017-08-20 LAB — POC GLUCOSE
Lab: 325 mg/dL — ABNORMAL HIGH (ref 70–100)
Lab: 204 mg/dL — ABNORMAL HIGH (ref 70–100)
Lab: 310 mg/dL — ABNORMAL HIGH (ref 70–100)
Lab: 294 mg/dL — ABNORMAL HIGH (ref 70–100)

## 2017-08-20 LAB — CBC
Lab: 14 K/UL — ABNORMAL HIGH (ref 4.5–11.0)
Lab: 3.5 M/UL — ABNORMAL LOW (ref 4.4–5.5)

## 2017-08-20 LAB — BASIC METABOLIC PANEL
Lab: 132 MMOL/L — ABNORMAL LOW (ref 137–147)
Lab: 4.2 MMOL/L — ABNORMAL LOW (ref 3.5–5.1)

## 2017-08-20 LAB — CULTURE-ANAEROBIC

## 2017-08-21 ENCOUNTER — Encounter: Admit: 2017-08-21 | Discharge: 2017-08-21 | Payer: MEDICARE

## 2017-08-21 ENCOUNTER — Inpatient Hospital Stay: Admit: 2017-08-21 | Discharge: 2017-08-21 | Payer: MEDICARE

## 2017-08-21 LAB — POC GLUCOSE
Lab: 275 mg/dL — ABNORMAL HIGH (ref 70–100)
Lab: 135 mg/dL — ABNORMAL HIGH (ref 70–100)
Lab: 137 mg/dL — ABNORMAL HIGH (ref 70–100)

## 2017-08-21 LAB — BASIC METABOLIC PANEL: Lab: 131 MMOL/L — ABNORMAL LOW (ref 137–147)

## 2017-08-21 LAB — CBC: Lab: 14 K/UL — ABNORMAL HIGH (ref 4.5–11.0)

## 2017-08-21 MED ORDER — PROMETHAZINE 25 MG/ML IJ SOLN
6.25 mg | Freq: Once | INTRAVENOUS | 0 refills | Status: CP
Start: 2017-08-21 — End: ?
  Administered 2017-08-22: 04:00:00 6.25 mg via INTRAVENOUS

## 2017-08-21 MED ORDER — HYDRALAZINE 20 MG/ML IJ SOLN
10 mg | INTRAVENOUS | 0 refills | Status: DC | PRN
Start: 2017-08-21 — End: 2017-08-22
  Administered 2017-08-21 – 2017-08-22 (×2): 10 mg via INTRAVENOUS

## 2017-08-21 MED ORDER — MILK/MOLASSES(#) 1:1 RECTAL ENEMA
1 | RECTAL | 0 refills | Status: DC
Start: 2017-08-21 — End: 2017-08-22
  Administered 2017-08-21 – 2017-08-22 (×4): 200 mL via RECTAL

## 2017-08-21 MED ORDER — BISACODYL 10 MG RE SUPP
10 mg | Freq: Once | RECTAL | 0 refills | Status: DC
Start: 2017-08-21 — End: 2017-08-21

## 2017-08-21 MED ORDER — BISACODYL 10 MG RE SUPP
20 mg | RECTAL | 0 refills | Status: DC
Start: 2017-08-21 — End: 2017-08-22
  Administered 2017-08-21 – 2017-08-22 (×2): 20 mg via RECTAL

## 2017-08-22 ENCOUNTER — Inpatient Hospital Stay: Admit: 2017-08-13 | Discharge: 2017-08-13 | Payer: MEDICARE

## 2017-08-22 ENCOUNTER — Encounter: Admit: 2017-08-22 | Discharge: 2017-08-22 | Payer: MEDICARE

## 2017-08-22 ENCOUNTER — Inpatient Hospital Stay: Admit: 2017-08-06 | Discharge: 2017-08-06 | Payer: MEDICARE

## 2017-08-22 ENCOUNTER — Encounter: Admit: 2017-08-05 | Discharge: 2017-08-05 | Payer: MEDICARE

## 2017-08-22 ENCOUNTER — Inpatient Hospital Stay: Admit: 2017-08-10 | Discharge: 2017-08-10 | Payer: MEDICARE

## 2017-08-22 DIAGNOSIS — I10 Essential (primary) hypertension: ICD-10-CM

## 2017-08-22 DIAGNOSIS — E876 Hypokalemia: ICD-10-CM

## 2017-08-22 DIAGNOSIS — E1169 Type 2 diabetes mellitus with other specified complication: ICD-10-CM

## 2017-08-22 DIAGNOSIS — B9561 Methicillin susceptible Staphylococcus aureus infection as the cause of diseases classified elsewhere: ICD-10-CM

## 2017-08-22 DIAGNOSIS — K567 Ileus, unspecified: ICD-10-CM

## 2017-08-22 DIAGNOSIS — T847XXA Infection and inflammatory reaction due to other internal orthopedic prosthetic devices, implants and grafts, initial encounter: Principal | ICD-10-CM

## 2017-08-22 DIAGNOSIS — M868X7 Other osteomyelitis, ankle and foot: ICD-10-CM

## 2017-08-22 DIAGNOSIS — E871 Hypo-osmolality and hyponatremia: ICD-10-CM

## 2017-08-22 DIAGNOSIS — A4101 Sepsis due to Methicillin susceptible Staphylococcus aureus: ICD-10-CM

## 2017-08-22 DIAGNOSIS — S9306XA Dislocation of unspecified ankle joint, initial encounter: ICD-10-CM

## 2017-08-22 DIAGNOSIS — G629 Polyneuropathy, unspecified: ICD-10-CM

## 2017-08-22 DIAGNOSIS — E119 Type 2 diabetes mellitus without complications: Principal | ICD-10-CM

## 2017-08-22 LAB — BASIC METABOLIC PANEL
Lab: 0.7 mg/dL — ABNORMAL HIGH (ref 0.4–1.24)
Lab: 134 MMOL/L — ABNORMAL LOW (ref 137–147)
Lab: 207 mg/dL — ABNORMAL HIGH (ref 70–100)
Lab: 26 MMOL/L (ref 21–30)
Lab: 27 mg/dL — ABNORMAL HIGH (ref 7–25)
Lab: 3.8 MMOL/L — ABNORMAL LOW (ref 3.5–5.1)
Lab: 9 pg (ref 3–12)
Lab: 9.5 mg/dL (ref 8.5–10.6)
Lab: 99 MMOL/L — ABNORMAL LOW (ref 98–110)
Lab: >60 mL/min (ref >60)
Lab: >60 mL/min (ref >60)

## 2017-08-22 LAB — POC GLUCOSE
Lab: 164 mg/dL — ABNORMAL HIGH (ref 70–100)
Lab: 180 mg/dL — ABNORMAL HIGH (ref 70–100)
Lab: 187 mg/dL — ABNORMAL HIGH (ref 70–100)
Lab: 230 mg/dL — ABNORMAL HIGH (ref 70–100)
Lab: 241 mg/dL — ABNORMAL HIGH (ref 70–100)

## 2017-08-22 LAB — CBC: Lab: 16 10*3/uL — ABNORMAL HIGH (ref 4.5–11.0)

## 2017-08-22 MED ORDER — MILK/MOLASSES(#) 1:1 RECTAL ENEMA
1 | Freq: Every day | RECTAL | 0 refills | Status: DC | PRN
Start: 2017-08-22 — End: 2017-08-22

## 2017-08-22 MED ORDER — AMPICILLIN/SULBACTAM 3G NS IVPB (NON-STD)
3 g | INTRAVENOUS | 0 refills | Status: AC
Start: 2017-08-22 — End: 2017-08-22

## 2017-08-22 MED ORDER — POLYETHYLENE GLYCOL 3350 17 GRAM PO PWPK
1 | Freq: Every day | ORAL | 0 refills | Status: DC
Start: 2017-08-22 — End: 2017-08-22
  Administered 2017-08-22: 19:00:00 17 g via ORAL

## 2017-08-22 MED ORDER — BISACODYL 10 MG RE SUPP
20 mg | Freq: Every day | RECTAL | 0 refills | Status: DC | PRN
Start: 2017-08-22 — End: 2017-08-22

## 2017-08-22 MED ORDER — BISACODYL 10 MG RE SUPP
20 mg | Freq: Every day | RECTAL | 1 refills | 1.00000 days | Status: AC | PRN
Start: 2017-08-22 — End: ?

## 2017-08-22 MED ORDER — POLYETHYLENE GLYCOL 3350 17 GRAM PO PWPK
17 g | Freq: Every day | ORAL | 1 refills | 18.00000 days | Status: AC
Start: 2017-08-22 — End: ?

## 2017-08-22 MED ORDER — DOCUSATE SODIUM 100 MG PO CAP
100 mg | Freq: Two times a day (BID) | ORAL | 0 refills | Status: DC
Start: 2017-08-22 — End: 2017-08-22
  Administered 2017-08-22: 19:00:00 100 mg via ORAL

## 2017-08-22 MED ORDER — AMPICILLIN/SULBACTAM 3G NS IVPB (NON-STD)
3 g | INTRAVENOUS | 0 refills | Status: AC
Start: 2017-08-22 — End: 2017-09-05

## 2017-08-22 MED ORDER — AMPICILLIN/SULBACTAM 3G NS IVPB (NON-STD)
3 g | INTRAVENOUS | 0 refills | Status: DC
Start: 2017-08-22 — End: 2017-08-22
  Administered 2017-08-22 (×2): 3 g via INTRAVENOUS

## 2017-08-23 ENCOUNTER — Encounter: Admit: 2017-08-23 | Discharge: 2017-08-23 | Payer: MEDICARE

## 2017-08-27 ENCOUNTER — Emergency Department: Admit: 2017-08-27 | Discharge: 2017-08-27 | Payer: MEDICARE

## 2017-08-27 ENCOUNTER — Encounter: Admit: 2017-08-27 | Discharge: 2017-08-27 | Payer: MEDICARE

## 2017-08-27 ENCOUNTER — Emergency Department: Admit: 2017-08-27 | Discharge: 2017-08-28 | Disposition: A | Discharge status: Home / Self Care | Payer: MEDICARE

## 2017-08-27 DIAGNOSIS — G629 Polyneuropathy, unspecified: ICD-10-CM

## 2017-08-27 DIAGNOSIS — S82891A Other fracture of right lower leg, initial encounter for closed fracture: Principal | ICD-10-CM

## 2017-08-27 DIAGNOSIS — E119 Type 2 diabetes mellitus without complications: Principal | ICD-10-CM

## 2017-08-27 DIAGNOSIS — I10 Essential (primary) hypertension: ICD-10-CM

## 2017-08-28 DIAGNOSIS — S82891A Other fracture of right lower leg, initial encounter for closed fracture: Principal | ICD-10-CM

## 2017-08-28 LAB — CBC AND DIFF
Lab: 0.1 K/UL — ABNORMAL HIGH (ref 0–0.20)
Lab: 0.5 K/UL — ABNORMAL HIGH (ref 0–0.45)
Lab: 13 10*3/uL — ABNORMAL HIGH (ref 4.5–11.0)

## 2017-08-28 LAB — POC LACTATE: Lab: 0.7 MMOL/L (ref 0.5–2.0)

## 2017-08-28 LAB — COMPREHENSIVE METABOLIC PANEL
Lab: 132 MMOL/L — ABNORMAL LOW (ref 137–147)
Lab: 136 mg/dL — ABNORMAL HIGH (ref 70–100)
Lab: 197 U/L — ABNORMAL HIGH (ref 25–110)
Lab: 30 U/L (ref 7–40)
Lab: 6.6 g/dL — ABNORMAL HIGH (ref 6.0–8.0)
Lab: 98 MMOL/L — ABNORMAL HIGH (ref 98–110)

## 2017-08-28 LAB — C REACTIVE PROTEIN (CRP): Lab: 10 mg/dL — ABNORMAL HIGH (ref <1.0)

## 2017-08-30 ENCOUNTER — Ambulatory Visit: Admit: 2017-08-30 | Discharge: 2017-08-30 | Payer: MEDICARE

## 2017-08-30 ENCOUNTER — Ambulatory Visit: Admit: 2017-08-30 | Discharge: 2017-08-31 | Payer: MEDICARE

## 2017-08-30 ENCOUNTER — Encounter: Admit: 2017-08-30 | Discharge: 2017-08-30 | Payer: MEDICARE

## 2017-08-30 DIAGNOSIS — M86471 Chronic osteomyelitis with draining sinus, right ankle and foot: Principal | ICD-10-CM

## 2017-08-30 DIAGNOSIS — M25572 Pain in left ankle and joints of left foot: Principal | ICD-10-CM

## 2017-09-02 ENCOUNTER — Inpatient Hospital Stay: Admit: 2017-09-02 | Discharge: 2017-09-02 | Payer: MEDICARE

## 2017-09-02 LAB — CULTURE-BLOOD W/SENSITIVITY

## 2017-09-03 ENCOUNTER — Encounter: Admit: 2017-09-03 | Discharge: 2017-09-03 | Payer: MEDICARE

## 2017-09-03 ENCOUNTER — Inpatient Hospital Stay: Admit: 2017-09-03 | Discharge: 2017-09-03 | Payer: MEDICARE

## 2017-09-03 LAB — POC GLUCOSE
Lab: 105 mg/dL — ABNORMAL HIGH (ref 70–100)
Lab: 109 mg/dL — ABNORMAL HIGH (ref 70–100)
Lab: 139 mg/dL — ABNORMAL HIGH (ref 70–100)
Lab: 70 mg/dL (ref 70–100)
Lab: 80 mg/dL (ref 70–100)

## 2017-09-03 MED ORDER — DIPHENHYDRAMINE HCL 25 MG PO CAP
25 mg | ORAL | 0 refills | Status: DC | PRN
Start: 2017-09-03 — End: 2017-09-06
  Administered 2017-09-05: 06:00:00 25 mg via ORAL

## 2017-09-03 MED ORDER — ACETAMINOPHEN 325 MG PO TAB
650 mg | ORAL | 0 refills | Status: DC
Start: 2017-09-03 — End: 2017-09-06
  Administered 2017-09-04 – 2017-09-05 (×7): 650 mg via ORAL

## 2017-09-03 MED ORDER — MORPHINE 2 MG/ML IV CRTG
1-2 mg | INTRAVENOUS | 0 refills | Status: DC | PRN
Start: 2017-09-03 — End: 2017-09-06
  Administered 2017-09-05: 05:00:00 1 mg via INTRAVENOUS

## 2017-09-03 MED ORDER — VENLAFAXINE 37.5 MG PO TAB
37.5 mg | Freq: Two times a day (BID) | ORAL | 0 refills | Status: DC
Start: 2017-09-03 — End: 2017-09-06
  Administered 2017-09-04 – 2017-09-05 (×4): 37.5 mg via ORAL

## 2017-09-03 MED ORDER — INSULIN ASPART 100 UNIT/ML SC FLEXPEN
0-24 [IU] | Freq: Before meals | SUBCUTANEOUS | 0 refills | Status: DC
Start: 2017-09-03 — End: 2017-09-06
  Administered 2017-09-04: 15:00:00 8 [IU] via SUBCUTANEOUS

## 2017-09-03 MED ORDER — OXYCODONE 5 MG PO TAB
5-10 mg | ORAL | 0 refills | Status: DC | PRN
Start: 2017-09-03 — End: 2017-09-04

## 2017-09-03 MED ORDER — CEFAZOLIN 1 GRAM IJ SOLR
0 refills | Status: DC
Start: 2017-09-03 — End: 2017-09-03
  Administered 2017-09-03: 22:00:00 2 g via INTRAVENOUS

## 2017-09-03 MED ORDER — DEXAMETHASONE SODIUM PHOSPHATE 10 MG/ML IJ SOLN
0 refills | Status: DC
Start: 2017-09-03 — End: 2017-09-03
  Administered 2017-09-03: 21:00:00 2 mg via INTRAVENOUS

## 2017-09-03 MED ORDER — SODIUM CHLORIDE 0.9 % IV SOLP
INTRAVENOUS | 0 refills | Status: DC
Start: 2017-09-03 — End: 2017-09-06
  Administered 2017-09-04: 01:00:00 1000.000 mL via INTRAVENOUS

## 2017-09-03 MED ORDER — ASPIRIN 325 MG PO TAB
325 mg | Freq: Every day | ORAL | 0 refills | Status: DC
Start: 2017-09-03 — End: 2017-09-06
  Administered 2017-09-04 – 2017-09-05 (×3): 325 mg via ORAL

## 2017-09-03 MED ORDER — DOCUSATE SODIUM 100 MG PO CAP
100 mg | Freq: Two times a day (BID) | ORAL | 0 refills | Status: DC
Start: 2017-09-03 — End: 2017-09-06
  Administered 2017-09-04 – 2017-09-05 (×3): 100 mg via ORAL

## 2017-09-03 MED ORDER — FENTANYL CITRATE (PF) 50 MCG/ML IJ SOLN
0 refills | Status: DC
Start: 2017-09-03 — End: 2017-09-03
  Administered 2017-09-03: 23:00:00 50 ug via INTRAVENOUS
  Administered 2017-09-03: 22:00:00 100 ug via INTRAVENOUS

## 2017-09-03 MED ORDER — INSULIN GLARGINE 100 UNIT/ML (3 ML) SC INJ PEN
60 [IU] | Freq: Two times a day (BID) | SUBCUTANEOUS | 0 refills | Status: DC
Start: 2017-09-03 — End: 2017-09-04
  Administered 2017-09-04: 15:00:00 60 [IU] via SUBCUTANEOUS

## 2017-09-03 MED ORDER — ONDANSETRON HCL (PF) 4 MG/2 ML IJ SOLN
4 mg | INTRAVENOUS | 0 refills | Status: DC | PRN
Start: 2017-09-03 — End: 2017-09-06

## 2017-09-03 MED ORDER — OXYCODONE 5 MG PO TAB
5-10 mg | ORAL | 0 refills | Status: DC | PRN
Start: 2017-09-03 — End: 2017-09-06
  Administered 2017-09-04 – 2017-09-05 (×4): 10 mg via ORAL

## 2017-09-03 MED ORDER — ATENOLOL-CHLORTHALIDONE 100-25 MG TAB
Freq: Every day | ORAL | 0 refills | Status: DC
Start: 2017-09-03 — End: 2017-09-04

## 2017-09-03 MED ORDER — BUPIVACAINE 0.5 % (5 MG/ML) IJ SOLN
0 refills | Status: DC
Start: 2017-09-03 — End: 2017-09-03
  Administered 2017-09-03: 21:00:00 10 mL

## 2017-09-03 MED ORDER — PHENYLEPHRINE IN 0.9% NACL(PF) 1 MG/10 ML (100 MCG/ML) IV SYRG
INTRAVENOUS | 0 refills | Status: DC
Start: 2017-09-03 — End: 2017-09-03
  Administered 2017-09-03 (×6): 100 ug via INTRAVENOUS

## 2017-09-03 MED ORDER — SUCCINYLCHOLINE CHLORIDE 20 MG/ML IJ SOLN
INTRAVENOUS | 0 refills | Status: DC
Start: 2017-09-03 — End: 2017-09-03
  Administered 2017-09-03: 22:00:00 120 mg via INTRAVENOUS

## 2017-09-03 MED ORDER — LIDOCAINE (PF) 10 MG/ML (1 %) IJ SOLN
.1-2 mL | INTRAMUSCULAR | 0 refills | Status: DC | PRN
Start: 2017-09-03 — End: 2017-09-04

## 2017-09-03 MED ORDER — GABAPENTIN 300 MG PO CAP
300 mg | Freq: Three times a day (TID) | ORAL | 0 refills | Status: CP
Start: 2017-09-03 — End: ?
  Administered 2017-09-04 – 2017-09-05 (×6): 300 mg via ORAL

## 2017-09-03 MED ORDER — GABAPENTIN 300 MG PO CAP
600 mg | Freq: Two times a day (BID) | ORAL | 0 refills | Status: DC
Start: 2017-09-03 — End: 2017-09-04

## 2017-09-03 MED ORDER — DIPHENHYDRAMINE HCL 50 MG/ML IJ SOLN
25 mg | INTRAVENOUS | 0 refills | Status: DC | PRN
Start: 2017-09-03 — End: 2017-09-06

## 2017-09-03 MED ORDER — CELECOXIB 200 MG PO CAP
200 mg | Freq: Two times a day (BID) | ORAL | 0 refills | Status: DC
Start: 2017-09-03 — End: 2017-09-06
  Administered 2017-09-04 – 2017-09-05 (×4): 200 mg via ORAL

## 2017-09-03 MED ORDER — PROMETHAZINE 25 MG/ML IJ SOLN
6.25 mg | INTRAVENOUS | 0 refills | Status: DC | PRN
Start: 2017-09-03 — End: 2017-09-04

## 2017-09-03 MED ORDER — DEXTROSE 50 % IN WATER (D50W) IV SYRG
0 refills | Status: DC
Start: 2017-09-03 — End: 2017-09-03
  Administered 2017-09-03: 22:00:00 25 mL via INTRAVENOUS

## 2017-09-03 MED ORDER — DEXTROSE 50 % IN WATER (D50W) IV SOLP
25 mL | Freq: Once | INTRAVENOUS | 0 refills | Status: AC
Start: 2017-09-03 — End: ?

## 2017-09-03 MED ORDER — MIDAZOLAM 1 MG/ML IJ SOLN
0 refills | Status: DC
Start: 2017-09-03 — End: 2017-09-03
  Administered 2017-09-03: 21:00:00 2 mg via INTRAVENOUS

## 2017-09-03 MED ORDER — POLYETHYLENE GLYCOL 3350 17 GRAM PO PWPK
17 g | Freq: Every day | ORAL | 0 refills | Status: DC
Start: 2017-09-03 — End: 2017-09-06
  Administered 2017-09-04 – 2017-09-05 (×3): 17 g via ORAL

## 2017-09-03 MED ORDER — LIDOCAINE (PF) 200 MG/10 ML (2 %) IJ SYRG
0 refills | Status: DC
Start: 2017-09-03 — End: 2017-09-03
  Administered 2017-09-03: 22:00:00 180 mg via INTRAVENOUS

## 2017-09-03 MED ORDER — DEXTRAN 70-HYPROMELLOSE (PF) 0.1-0.3 % OP DPET
0 refills | Status: DC
Start: 2017-09-03 — End: 2017-09-03
  Administered 2017-09-03: 22:00:00 2 [drp] via OPHTHALMIC

## 2017-09-03 MED ORDER — HYDROMORPHONE (PF) 2 MG/ML IJ SYRG
.5 mg | INTRAVENOUS | 0 refills | Status: DC | PRN
Start: 2017-09-03 — End: 2017-09-06

## 2017-09-03 MED ORDER — FENTANYL CITRATE (PF) 50 MCG/ML IJ SOLN
25-50 ug | INTRAVENOUS | 0 refills | Status: DC | PRN
Start: 2017-09-03 — End: 2017-09-04

## 2017-09-03 MED ORDER — MEPERIDINE (PF) 25 MG/ML IJ SYRG
12.5 mg | INTRAVENOUS | 0 refills | Status: DC | PRN
Start: 2017-09-03 — End: 2017-09-04

## 2017-09-03 MED ORDER — SODIUM CHLORIDE 0.9 % IR SOLN
0 refills | Status: DC
Start: 2017-09-03 — End: 2017-09-03
  Administered 2017-09-03: 22:00:00 1000 mL

## 2017-09-03 MED ORDER — ONDANSETRON HCL (PF) 4 MG/2 ML IJ SOLN
4 mg | Freq: Once | INTRAVENOUS | 0 refills | Status: DC | PRN
Start: 2017-09-03 — End: 2017-09-04

## 2017-09-03 MED ORDER — ACETAMINOPHEN 325 MG PO TAB
650 mg | ORAL | 0 refills | Status: DC | PRN
Start: 2017-09-03 — End: 2017-09-06
  Administered 2017-09-04 – 2017-09-05 (×2): 650 mg via ORAL

## 2017-09-03 MED ORDER — BUPIVACAINE 0.125% PCA PNC SYR
PERINEURAL | 0 refills | Status: DC
Start: 2017-09-03 — End: 2017-09-06
  Administered 2017-09-03 – 2017-09-05 (×9): 50.000 mL via PERINEURAL

## 2017-09-03 MED ORDER — MAGNESIUM OXIDE 400 MG (241.3 MG MAGNESIUM) PO TAB
1200 mg | Freq: Every day | ORAL | 0 refills | Status: DC
Start: 2017-09-03 — End: 2017-09-04

## 2017-09-03 MED ORDER — INSULIN DETEMIR U-100 100 UNIT/ML SC SOLN
60 [IU] | Freq: Two times a day (BID) | SUBCUTANEOUS | 0 refills | Status: DC
Start: 2017-09-03 — End: 2017-09-04

## 2017-09-03 MED ORDER — FENTANYL CITRATE (PF) 50 MCG/ML IJ SOLN
50 ug | INTRAVENOUS | 0 refills | Status: DC | PRN
Start: 2017-09-03 — End: 2017-09-04

## 2017-09-03 MED ORDER — ACETAMINOPHEN 650 MG RE SUPP
650 mg | RECTAL | 0 refills | Status: DC | PRN
Start: 2017-09-03 — End: 2017-09-06

## 2017-09-03 MED ORDER — CEFAZOLIN INJ 1GM IVP
2 g | INTRAVENOUS | 0 refills | Status: CP
Start: 2017-09-03 — End: ?
  Administered 2017-09-04 (×2): 2 g via INTRAVENOUS

## 2017-09-03 MED ORDER — BISACODYL 10 MG RE SUPP
10 mg | Freq: Every day | RECTAL | 0 refills | Status: DC | PRN
Start: 2017-09-03 — End: 2017-09-06
  Administered 2017-09-05: 15:00:00 10 mg via RECTAL

## 2017-09-03 MED ORDER — HALOPERIDOL LACTATE 5 MG/ML IJ SOLN
1 mg | Freq: Once | INTRAVENOUS | 0 refills | Status: DC | PRN
Start: 2017-09-03 — End: 2017-09-04

## 2017-09-03 MED ORDER — BISACODYL 10 MG RE SUPP
20 mg | Freq: Every day | RECTAL | 0 refills | Status: DC | PRN
Start: 2017-09-03 — End: 2017-09-04

## 2017-09-03 MED ORDER — ROPIVACAINE (PF) 5 MG/ML (0.5 %) IJ SOLN
0 refills | Status: DC
Start: 2017-09-03 — End: 2017-09-03
  Administered 2017-09-03: 21:00:00 30 mL

## 2017-09-03 MED ORDER — PROPOFOL INJ 10 MG/ML IV VIAL
0 refills | Status: DC
Start: 2017-09-03 — End: 2017-09-03
  Administered 2017-09-03: 22:00:00 140 mg via INTRAVENOUS
  Administered 2017-09-03: 23:00:00 20 mg via INTRAVENOUS

## 2017-09-03 MED ORDER — PHENYLEPHRINE IV DRIP (STD CONC)
0 refills | Status: DC
Start: 2017-09-03 — End: 2017-09-03
  Administered 2017-09-03 (×2): 0.2 ug/kg/min via INTRAVENOUS

## 2017-09-03 MED ORDER — LACTATED RINGERS IV SOLP
1000 mL | INTRAVENOUS | 0 refills | Status: AC
Start: 2017-09-03 — End: ?
  Administered 2017-09-03: 23:00:00 1000.000 mL via INTRAVENOUS
  Administered 2017-09-03: 19:00:00 1000 mL via INTRAVENOUS

## 2017-09-03 MED ORDER — MAGNESIUM HYDROXIDE 2,400 MG/10 ML PO SUSP
10 mL | Freq: Every day | ORAL | 0 refills | Status: DC
Start: 2017-09-03 — End: 2017-09-06
  Administered 2017-09-05: 03:00:00 10 mL via ORAL

## 2017-09-03 MED ORDER — OXYCODONE 5 MG PO TAB
5-10 mg | Freq: Once | ORAL | 0 refills | Status: DC | PRN
Start: 2017-09-03 — End: 2017-09-04

## 2017-09-03 MED ORDER — HYDROMORPHONE (PF) 2 MG/ML IJ SYRG
1 mg | INTRAVENOUS | 0 refills | Status: DC | PRN
Start: 2017-09-03 — End: 2017-09-04

## 2017-09-03 MED ORDER — ONDANSETRON HCL (PF) 4 MG/2 ML IJ SOLN
INTRAVENOUS | 0 refills | Status: DC
Start: 2017-09-03 — End: 2017-09-03
  Administered 2017-09-03: 23:00:00 4 mg via INTRAVENOUS

## 2017-09-03 MED ADMIN — DEXTROSE 50 % IN WATER (D50W) IV SYRG [2365]: 25 mL | INTRAVENOUS | @ 19:00:00 | Stop: 2017-09-03 | NDC 00409751716

## 2017-09-04 ENCOUNTER — Encounter: Admit: 2017-09-04 | Discharge: 2017-09-04 | Payer: MEDICARE

## 2017-09-04 DIAGNOSIS — G629 Polyneuropathy, unspecified: ICD-10-CM

## 2017-09-04 DIAGNOSIS — I10 Essential (primary) hypertension: ICD-10-CM

## 2017-09-04 DIAGNOSIS — E119 Type 2 diabetes mellitus without complications: Principal | ICD-10-CM

## 2017-09-04 LAB — BASIC METABOLIC PANEL
Lab: 0.8 mg/dL — ABNORMAL HIGH (ref 0.4–1.24)
Lab: 102 MMOL/L — ABNORMAL LOW (ref 98–110)
Lab: 107 mg/dL — ABNORMAL HIGH (ref 70–100)
Lab: 136 MMOL/L — ABNORMAL LOW (ref 137–147)
Lab: 18 mg/dL — ABNORMAL HIGH (ref 7–25)
Lab: 28 MMOL/L — ABNORMAL LOW (ref 21–30)
Lab: 4.1 MMOL/L — ABNORMAL LOW (ref 3.5–5.1)
Lab: 6 pg — ABNORMAL LOW (ref 3–12)
Lab: >60 mL/min (ref >60)
Lab: >60 mL/min (ref >60)

## 2017-09-04 LAB — IRON + BINDING CAPACITY + %SAT+ FERRITIN
Lab: 238 ug/dL — ABNORMAL LOW (ref 270–380)
Lab: 435 ng/mL — ABNORMAL HIGH (ref 30–300)
Lab: <10 ug/dL — ABNORMAL LOW (ref 50–185)

## 2017-09-04 LAB — CBC
Lab: 12 10*3/uL — ABNORMAL HIGH (ref 4.5–11.0)
Lab: 7.2 FL (ref 7–11)
Lab: 8.2 g/dL — ABNORMAL LOW (ref 13.5–16.5)

## 2017-09-04 LAB — POC GLUCOSE
Lab: 121 mg/dL — ABNORMAL HIGH (ref 70–100)
Lab: 248 mg/dL — ABNORMAL HIGH (ref 70–100)
Lab: 341 mg/dL — ABNORMAL HIGH (ref 70–100)
Lab: 297 mg/dL — ABNORMAL HIGH (ref 70–100)
Lab: 206 mg/dL — ABNORMAL HIGH (ref >60)

## 2017-09-04 LAB — HEMOGLOBIN A1C: Lab: 7.4 % — ABNORMAL HIGH (ref 4.0–6.0)

## 2017-09-04 MED ORDER — INSULIN GLARGINE 100 UNIT/ML (3 ML) SC INJ PEN
60 [IU] | Freq: Every day | SUBCUTANEOUS | 0 refills | Status: DC
Start: 2017-09-04 — End: 2017-09-06

## 2017-09-04 MED ORDER — INSULIN ASPART 100 UNIT/ML SC FLEXPEN
20 [IU] | Freq: Three times a day (TID) | SUBCUTANEOUS | 0 refills | Status: DC
Start: 2017-09-04 — End: 2017-09-06

## 2017-09-04 MED ORDER — ATENOLOL 100 MG PO TAB
100 mg | Freq: Every day | ORAL | 0 refills | Status: DC
Start: 2017-09-04 — End: 2017-09-06
  Administered 2017-09-05: 15:00:00 100 mg via ORAL

## 2017-09-05 ENCOUNTER — Inpatient Hospital Stay: Admit: 2017-09-03 | Discharge: 2017-09-03 | Payer: MEDICARE

## 2017-09-05 ENCOUNTER — Encounter: Admit: 2017-09-05 | Discharge: 2017-09-05 | Payer: MEDICARE

## 2017-09-05 ENCOUNTER — Inpatient Hospital Stay
Admit: 2017-09-03 | Discharge: 2017-09-05 | Disposition: A | Discharge status: Skilled Nursing Facility | Payer: MEDICARE

## 2017-09-05 DIAGNOSIS — T84622A Infection and inflammatory reaction due to internal fixation device of right tibia, initial encounter: Principal | ICD-10-CM

## 2017-09-05 DIAGNOSIS — E871 Hypo-osmolality and hyponatremia: ICD-10-CM

## 2017-09-05 DIAGNOSIS — B9689 Other specified bacterial agents as the cause of diseases classified elsewhere: ICD-10-CM

## 2017-09-05 DIAGNOSIS — M86471 Chronic osteomyelitis with draining sinus, right ankle and foot: ICD-10-CM

## 2017-09-05 DIAGNOSIS — Z794 Long term (current) use of insulin: ICD-10-CM

## 2017-09-05 DIAGNOSIS — D638 Anemia in other chronic diseases classified elsewhere: ICD-10-CM

## 2017-09-05 DIAGNOSIS — Z79899 Other long term (current) drug therapy: ICD-10-CM

## 2017-09-05 DIAGNOSIS — I10 Essential (primary) hypertension: ICD-10-CM

## 2017-09-05 DIAGNOSIS — E1140 Type 2 diabetes mellitus with diabetic neuropathy, unspecified: ICD-10-CM

## 2017-09-05 DIAGNOSIS — G629 Polyneuropathy, unspecified: ICD-10-CM

## 2017-09-05 DIAGNOSIS — E1169 Type 2 diabetes mellitus with other specified complication: ICD-10-CM

## 2017-09-05 DIAGNOSIS — K5900 Constipation, unspecified: ICD-10-CM

## 2017-09-05 DIAGNOSIS — E119 Type 2 diabetes mellitus without complications: Principal | ICD-10-CM

## 2017-09-05 LAB — POC GLUCOSE
Lab: 310 mg/dL — ABNORMAL HIGH (ref 70–100)
Lab: 128 mg/dL — ABNORMAL HIGH (ref 70–100)
Lab: 125 mg/dL — ABNORMAL HIGH (ref 70–100)
Lab: 61 mg/dL — ABNORMAL LOW (ref 70–100)
Lab: 56 mg/dL — ABNORMAL LOW (ref 70–100)
Lab: 82 mg/dL (ref 70–100)

## 2017-09-05 LAB — CBC
Lab: 25 pg — ABNORMAL LOW (ref >60)
Lab: 81 FL — ABNORMAL LOW (ref >60)

## 2017-09-05 LAB — BASIC METABOLIC PANEL: Lab: 135 MMOL/L — ABNORMAL LOW (ref 137–147)

## 2017-09-05 MED ORDER — INSULIN GLARGINE 100 UNIT/ML (3 ML) SC INJ PEN
60 [IU] | Freq: Every day | SUBCUTANEOUS | 3 refills | 68.00000 days | Status: AC
Start: 2017-09-05 — End: ?

## 2017-09-05 MED ORDER — INSULIN LISPRO 100 UNIT/ML SC INPN
20 [IU] | Freq: Three times a day (TID) | SUBCUTANEOUS | 0 refills | 39.00000 days | Status: AC
Start: 2017-09-05 — End: ?

## 2017-09-05 MED ORDER — CEPHALEXIN 500 MG PO CAP
500 mg | Freq: Four times a day (QID) | ORAL | 0 refills | Status: DC
Start: 2017-09-05 — End: 2017-09-06
  Administered 2017-09-05: 19:00:00 500 mg via ORAL

## 2017-09-05 MED ORDER — MAGNESIUM CITRATE PO SOLN
296 mL | Freq: Once | ORAL | 0 refills | Status: CP
Start: 2017-09-05 — End: ?
  Administered 2017-09-05: 16:00:00 296 mL via ORAL

## 2017-09-05 MED ORDER — ASPIRIN 325 MG PO TAB
325 mg | ORAL_TABLET | Freq: Every day | ORAL | 3 refills | 30.00000 days | Status: AC
Start: 2017-09-05 — End: ?

## 2017-09-05 MED ORDER — CEPHALEXIN 500 MG PO CAP
500 mg | ORAL_CAPSULE | Freq: Four times a day (QID) | ORAL | 0 refills | Status: AC
Start: 2017-09-05 — End: ?

## 2017-09-05 MED ORDER — HYDRALAZINE 20 MG/ML IJ SOLN
10 mg | INTRAVENOUS | 0 refills | Status: DC | PRN
Start: 2017-09-05 — End: 2017-09-06

## 2017-09-05 MED ORDER — OXYCODONE 5 MG PO TAB
5-10 mg | ORAL_TABLET | ORAL | 0 refills | 6.00000 days | Status: AC | PRN
Start: 2017-09-05 — End: ?

## 2017-09-05 MED ORDER — ROPIVACAINE 0.2% INFUSION (ON-Q PUMP CB004/P400X2-14)
PERINEURAL | 0 refills | Status: DC
Start: 2017-09-05 — End: 2017-09-06
  Administered 2017-09-05: 21:00:00 400.000 mL via PERINEURAL

## 2017-09-06 ENCOUNTER — Encounter: Admit: 2017-09-06 | Discharge: 2017-09-06 | Payer: MEDICARE

## 2017-09-07 ENCOUNTER — Encounter: Admit: 2017-09-07 | Discharge: 2017-09-07 | Payer: MEDICARE

## 2017-09-07 LAB — C REACTIVE PROTEIN (CRP): Lab: 127

## 2017-09-07 LAB — BUN: Lab: 14

## 2017-09-07 LAB — AST (SGOT): Lab: 28

## 2017-09-07 LAB — ALT (SGPT): Lab: 30

## 2017-09-08 ENCOUNTER — Encounter: Admit: 2017-09-08 | Discharge: 2017-09-08 | Payer: MEDICARE

## 2017-09-09 ENCOUNTER — Encounter: Admit: 2017-09-09 | Discharge: 2017-09-09 | Payer: MEDICARE

## 2017-09-10 ENCOUNTER — Encounter: Admit: 2017-09-10 | Discharge: 2017-09-10 | Payer: MEDICARE

## 2017-09-10 LAB — CULTURE-FUNGAL,OTHER

## 2017-09-11 ENCOUNTER — Encounter: Admit: 2017-09-11 | Discharge: 2017-09-11 | Payer: MEDICARE

## 2017-09-11 ENCOUNTER — Ambulatory Visit: Admit: 2017-09-11 | Discharge: 2017-09-11 | Payer: MEDICARE

## 2017-09-11 DIAGNOSIS — I10 Essential (primary) hypertension: ICD-10-CM

## 2017-09-11 DIAGNOSIS — E119 Type 2 diabetes mellitus without complications: Principal | ICD-10-CM

## 2017-09-11 DIAGNOSIS — M86471 Chronic osteomyelitis with draining sinus, right ankle and foot: Principal | ICD-10-CM

## 2017-09-11 DIAGNOSIS — G629 Polyneuropathy, unspecified: ICD-10-CM

## 2017-09-17 LAB — CULTURE-FUNGAL,OTHER

## 2017-09-25 ENCOUNTER — Ambulatory Visit: Admit: 2017-09-25 | Discharge: 2017-09-26 | Payer: MEDICARE

## 2017-09-25 ENCOUNTER — Encounter: Admit: 2017-09-25 | Discharge: 2017-09-25 | Payer: MEDICARE

## 2017-09-25 DIAGNOSIS — E119 Type 2 diabetes mellitus without complications: Principal | ICD-10-CM

## 2017-09-25 DIAGNOSIS — Z89511 Acquired absence of right leg below knee: Principal | ICD-10-CM

## 2017-09-25 DIAGNOSIS — G629 Polyneuropathy, unspecified: ICD-10-CM

## 2017-09-25 DIAGNOSIS — I10 Essential (primary) hypertension: ICD-10-CM

## 2017-09-25 MED ORDER — CEPHALEXIN 500 MG PO CAP
500 mg | ORAL_CAPSULE | Freq: Four times a day (QID) | ORAL | 0 refills | Status: AC
Start: 2017-09-25 — End: ?
  Filled 2017-09-25: qty 28, 7d supply

## 2017-10-05 ENCOUNTER — Encounter: Admit: 2017-10-05 | Discharge: 2017-10-05 | Payer: MEDICARE

## 2017-11-01 ENCOUNTER — Encounter: Admit: 2017-11-01 | Discharge: 2017-11-01 | Payer: MEDICARE

## 2017-11-01 DIAGNOSIS — G629 Polyneuropathy, unspecified: ICD-10-CM

## 2017-11-01 DIAGNOSIS — E119 Type 2 diabetes mellitus without complications: Principal | ICD-10-CM

## 2017-11-01 DIAGNOSIS — I10 Essential (primary) hypertension: ICD-10-CM

## 2021-03-19 IMAGING — CT CHESTWO
2 of 6 series · 13 of 36 positions shown, 16 images · non-contrast
Comparison: CT abdomen pelvis April 14, 2020.

DIAGNOSTIC STUDIES

EXAM:  CT CHEST, ABDOMEN AND PELVIS WITHOUT INTRAVENOUS CONTRAST  ([id number], [id number])
INDICATION: trauma Fall. Trauma. Pt c/o left sided pain. CT/NM 1/0. CF
TECHNIQUE: Axial computed tomography images of the chest, abdomen and pelvis without intravenous
contrast.  Sagittal and coronal reformatted images were created and reviewed.

[Series 503: thorax cor 1.50 br40 s3 · coronal · 0.64mm/px · 3 of 229 slices shown]
[im 46/229  lung]
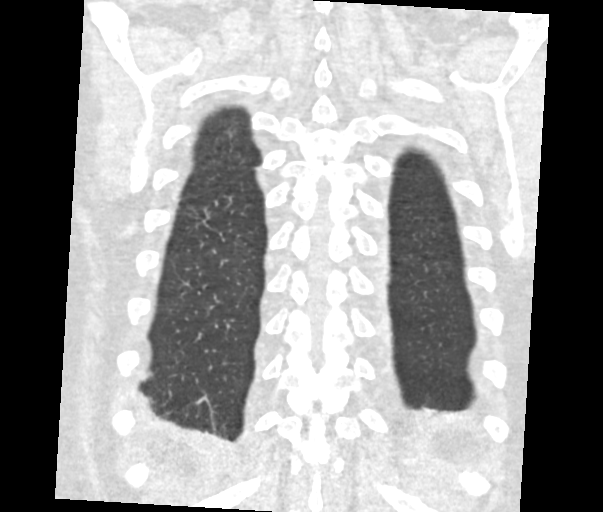
[im 92/229  lung]
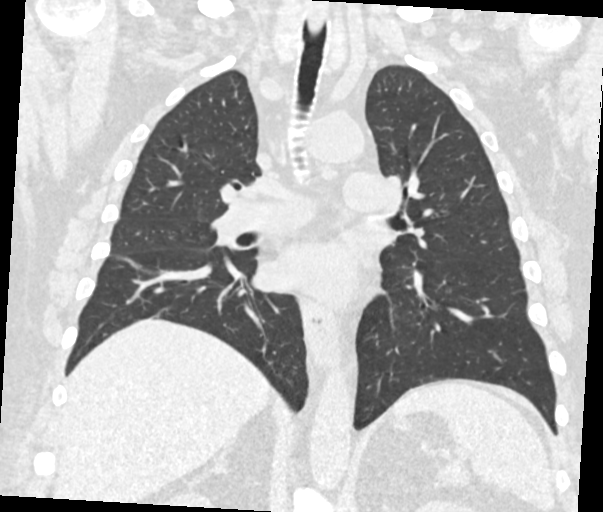
[im 137/229  lung]
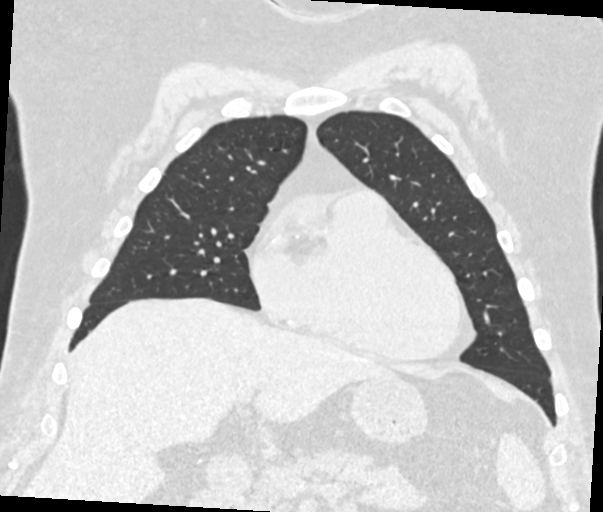

[Series 507: thorax 1.00 br60 s3 · axial · 0.76mm/px · z∈[+1589,+1866]mm · 10 of 470 slices shown, 13 images]
[im 37/470  mediastinal]
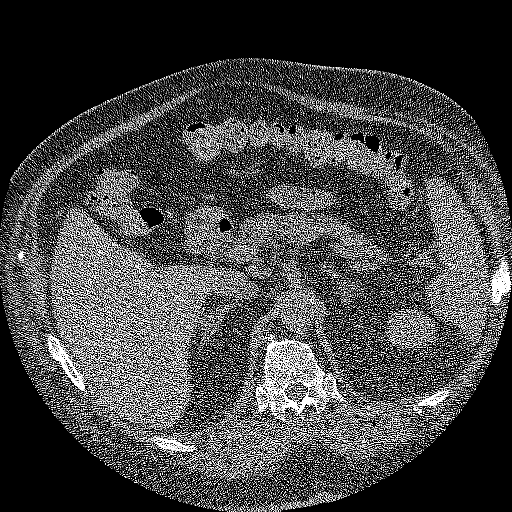
[im 37/470  lung]
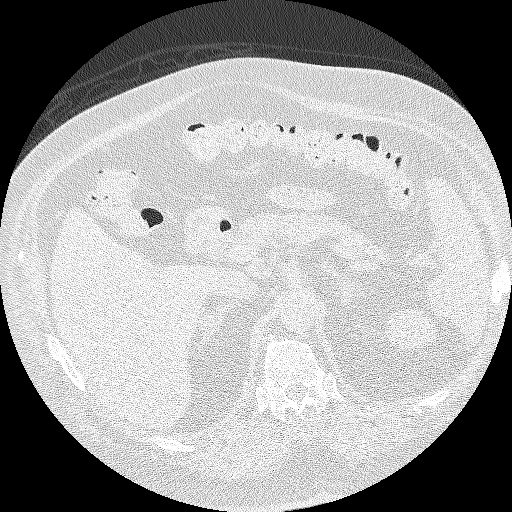
[im 73/470  lung]
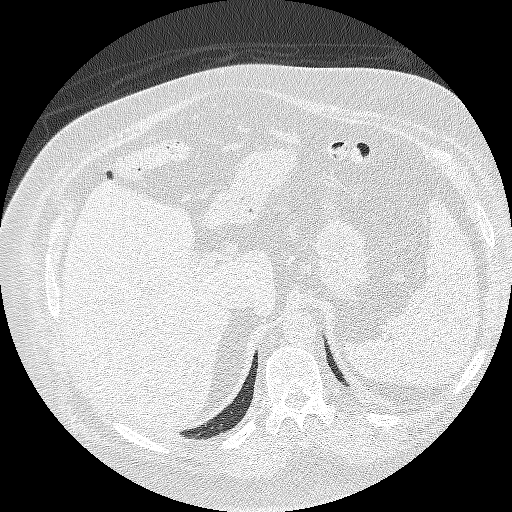
[im 145/470  lung]
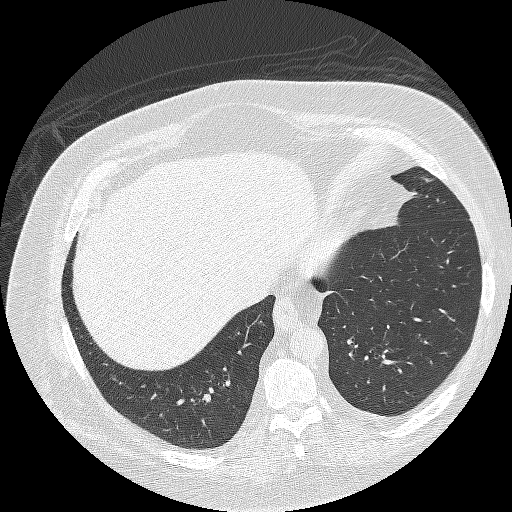
[im 181/470  lung]
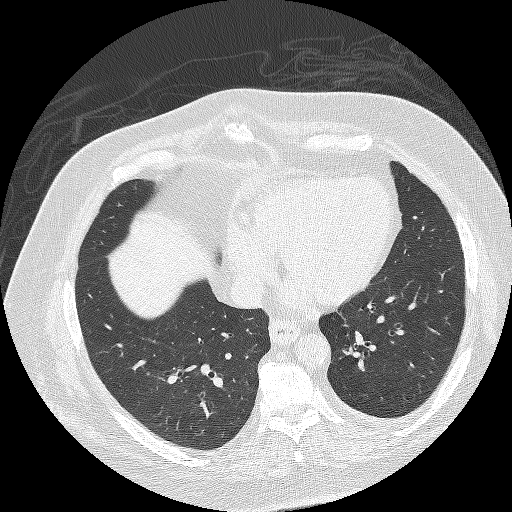
[im 217/470  mediastinal]
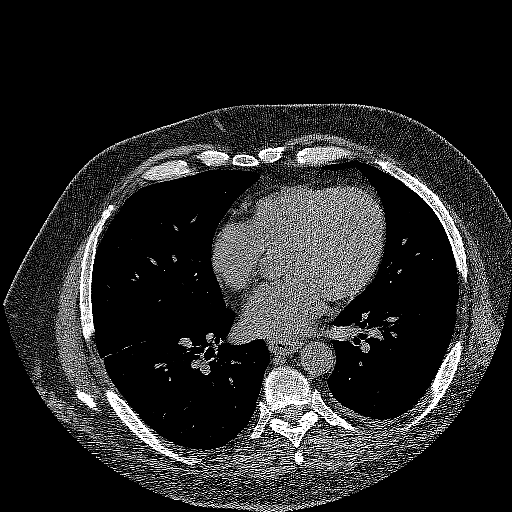
[im 217/470  lung]
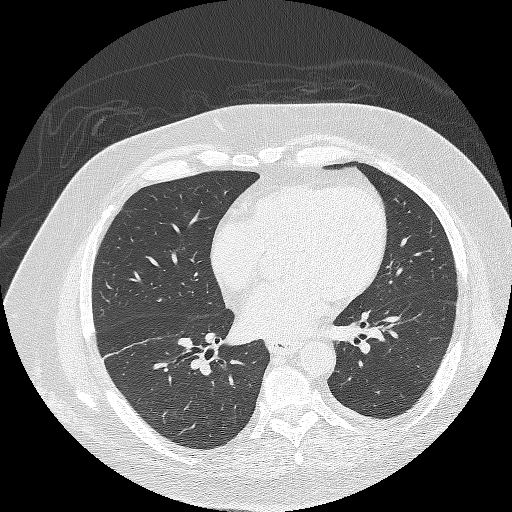
[im 253/470  lung]
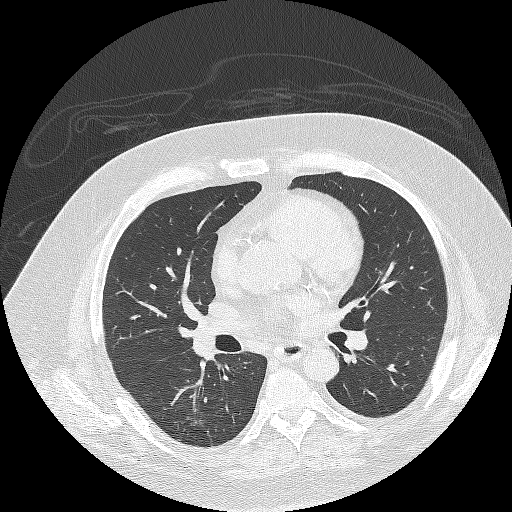
[im 289/470  lung]
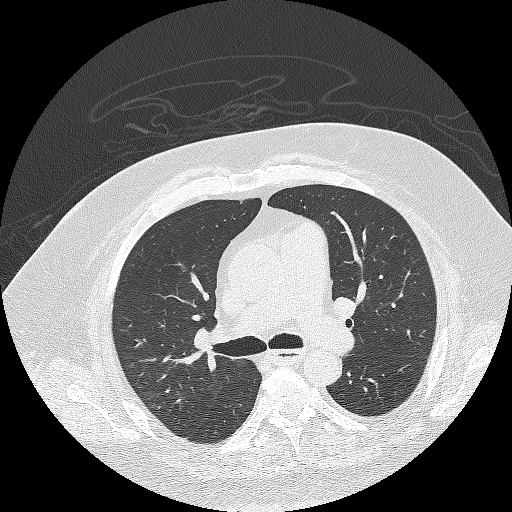
[im 361/470  lung]
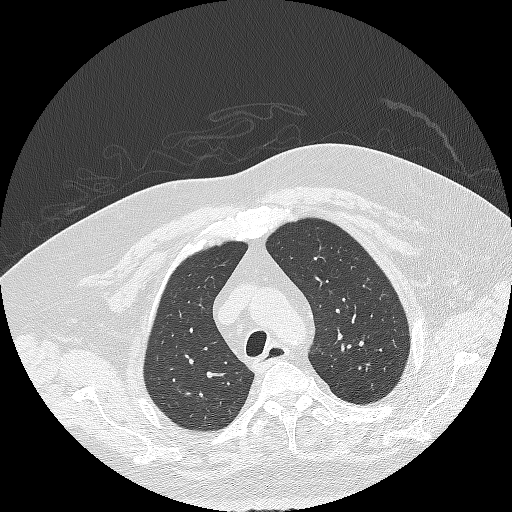
[im 397/470  mediastinal]
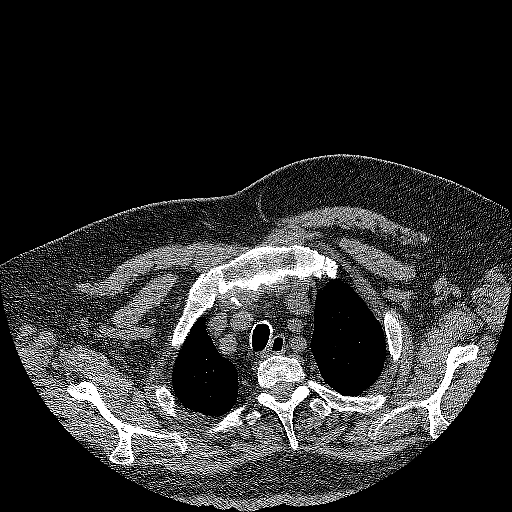
[im 397/470  lung]
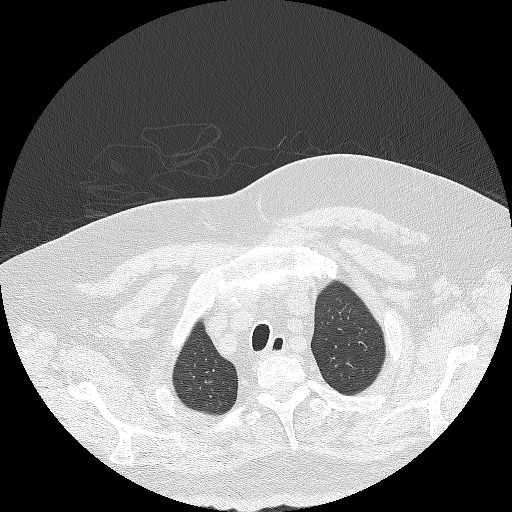
[im 433/470  lung]
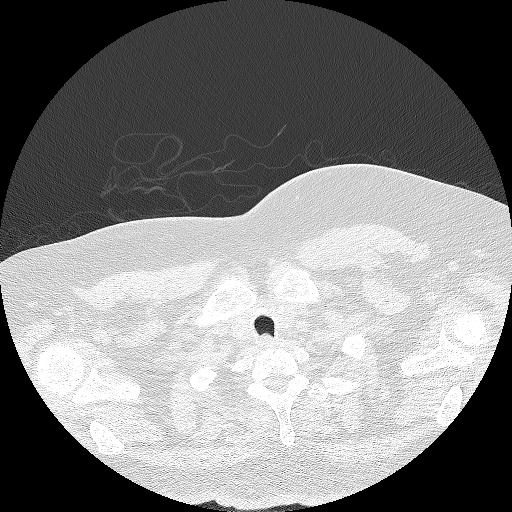

[13 of 36 positions shown; findings below may reference images not displayed]

All CT scans at this facility use dose modulation, interval reconstruction, and/or weight-based
dosing when appropriate to reduce radiation dose to as low as reasonably achievable.

Number of previous computed tomography exams in the last 12 months is 1 .

Number of previous nuclear medicine myocardial perfusion studies in the last 12 months is 0 .
FINDINGS: LUNGS:  The lungs demonstrate NO consolidations or significant effusions.Minimal fibrotic appearing
changes in the lung bases.

HEART:  Calcification is present within the visualized coronary arteries. Limited/incomplete
evaluation.

MEDIASTINUM:  NO significantly/pathologically enlarged lymph nodes.

Minimal hiatal hernia.

LIVER:  NO mass or abnormal enlargement.

GALLBLADDER AND BILE DUCTS:  Postsurgical changes from prior cholecystectomy.

PANCREAS:  NO evidence of edema/inflammatory changes. NO definite mass. Small complex-appearing air
collection in the area of the pancreas consistent with a duodenal diverticulum.

SPLEEN:  NO mass or abnormal enlargement.

ADRENALS:  NO suspicious masses or abnormal enlargement.

KIDNEYS AND URETERS:  NO hydronephrosis, obstructing stones or masses on the RIGHT.

NO hydronephrosis, obstructing stones or masses on the LEFT.

URETERS: NO definite ureteral stones.

STOMACH AND BOWEL:  STOMACH: NO abnormal thickening or distention.

SMALL BOWEL AND COLON: NO abnormal thickening or distention of the small bowel.

NO abnormal thickening or distention of the colon. Minimal diverticulosis. NO evidence of
diverticulitis.

The colon is moderately fecal filled. No significant distention.

APPENDIX:  The appendix is not identified.

BLADDER:  NO significant wall thickening or stones.

PELVIS: NO definite evidence of pelvic masses.

INTRAPERITONEAL SPACE:  No free air.  No significant fluid collection.

BONES/JOINTS:  NO definite acute compression or posterior element fractures in the thoracic spine.
Minimal degenerative changes.

NO definite acute compression or posterior element fractures in the lumbar spine.

Remote appearing compression fracture in the superior vertebral endplate at L1.

Pedicle screws and rods  at the L5 and S1 levels. NO change from prior study.

Nondisplaced fractures in the anterior RIGHT sixth and possibly fifth ribs. Old fractures in the
inferior lateral RIGHT ribs.

SOFT TISSUES:  Soft tissues demonstrate NO fluid collections or foreign body.

Area(s) of increased attenuation in the subcutaneous tissues. The differential includes edema,
infection and/or inflammatory changes and iatrogenic changes and associated with recent injection
in the anterior subcutaneous soft tissue. NO change from prior study.

Mild subcutaneous soft tissue increased attenuation in the mid lateral and flank areas of the
abdominal subcutaneous soft tissue.

VASCULATURE:  AORTA: The thoracic aorta demonstrates no evidence of aneurysm or rupture as
visualized.  Minimal vascular calcification.

PULMONARY ARTERIES: Noncontrast study of the pulmonary artery is grossly unremarkable. Embolus NOT
evaluated. .

AORTA: The abdominal aorta demonstrates no evidence of aneurysm or rupture as visualized.  Mild
vascular calcification.

LYMPH NODES:  Nonspecific lymph node is present on the LEFT periaortic area.

Stable moderate to large fat replaced lymph nodes in the pelvis.
IMPRESSION: - Nondisplaced fractures in the anterior RIGHT sixth and possibly fifth ribs. Old fractures in the
inferior lateral RIGHT ribs.

- Mild subcutaneous soft tissue increased attenuation in the mid lateral and flank areas of the
abdominal subcutaneous soft tissue.  This suggests edema. NO change from prior study.

-  NO pneumonia and/or significant atelectasis.

- NO bowel or renal obstruction or diverticulitis.

- Calcification is present within the visualized coronary arteries. Limited/incomplete evaluation.
This may have implications for possible adverse cardiac events. Further evaluation is
recommended/indicated.

- Stable moderate to large fat replaced lymph nodes in the pelvis.  This is of uncertain etiology.

- The colon is moderately fecal filled. No significant distention.

- Chronic/incidental findings as described.

Tech Notes:

Fall. Trauma. Pt c/o left sided pain. CT/NM 1/0. CF

## 2021-03-19 IMAGING — CT ABDPELWO
2 of 3 series · 12 of 46 positions shown, 14 images · non-contrast
Comparison: CT abdomen pelvis April 14, 2020.

DIAGNOSTIC STUDIES

EXAM:  CT CHEST, ABDOMEN AND PELVIS WITHOUT INTRAVENOUS CONTRAST  ([id number], [id number])
INDICATION: trauma Fall. Trauma. Pt c/o left sided pain. CT/NM 1/0. CF
TECHNIQUE: Axial computed tomography images of the chest, abdomen and pelvis without intravenous
contrast.  Sagittal and coronal reformatted images were created and reviewed.

[Series 15: abdomen ax 5.00 br40 s3 · axial · 0.75mm/px · z∈[+1245,+1705]mm · 9 of 106 slices shown, 11 images]
[im 7/106  soft-tissue]
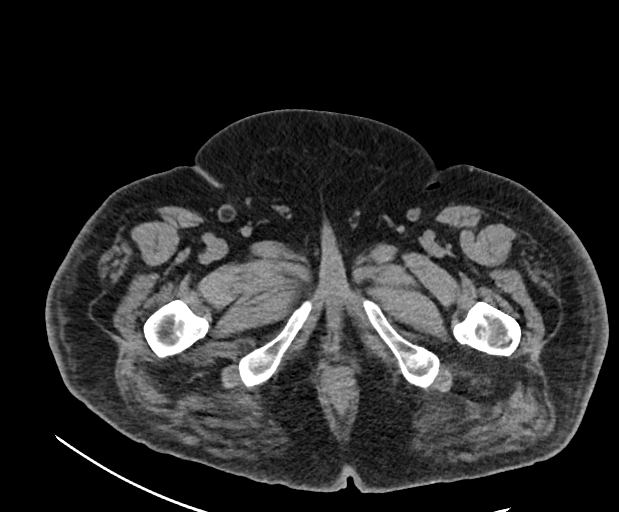
[im 7/106  bone]
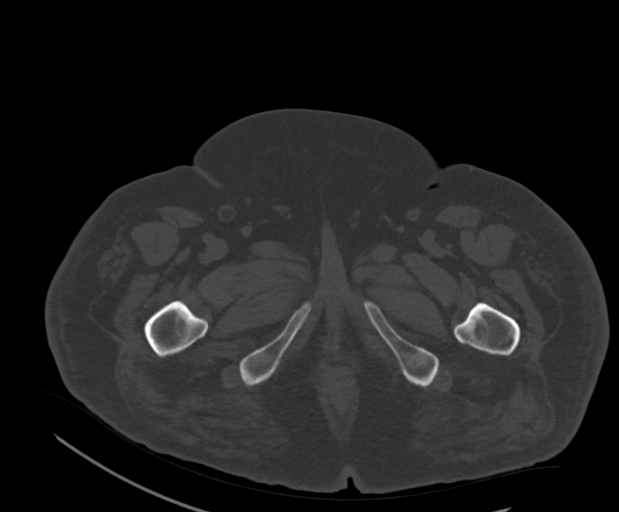
[im 21/106  soft-tissue]
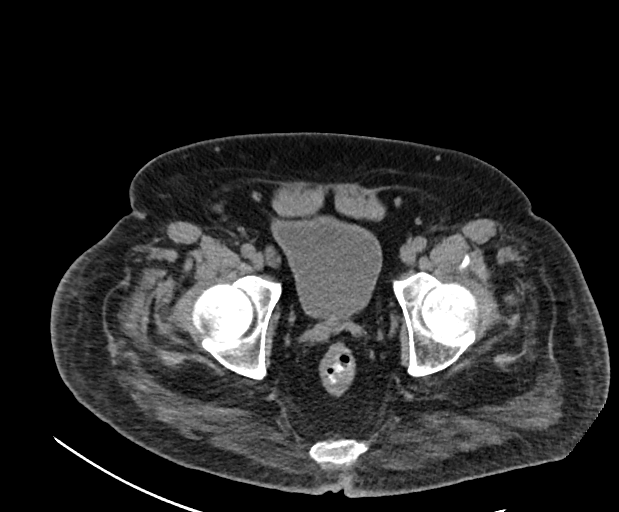
[im 31/106  soft-tissue]
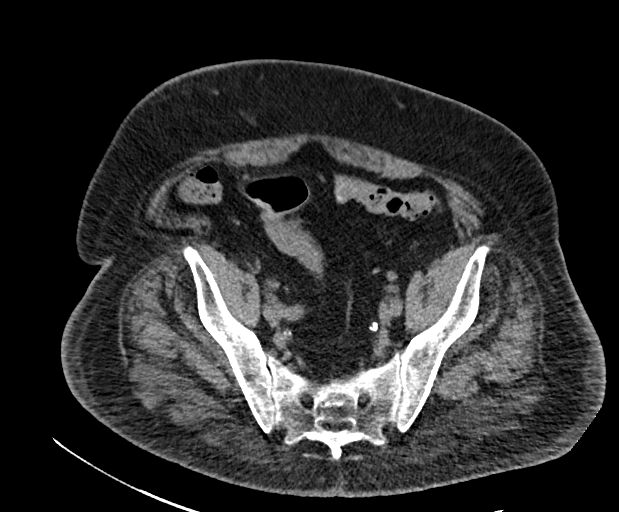
[im 41/106  soft-tissue]
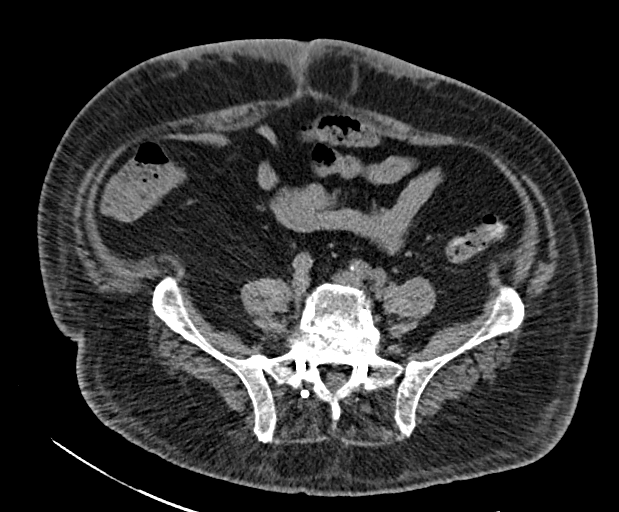
[im 55/106  soft-tissue]
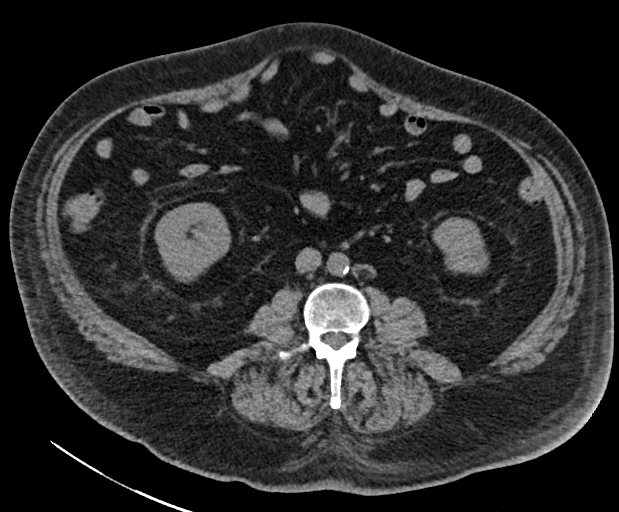
[im 65/106  soft-tissue]
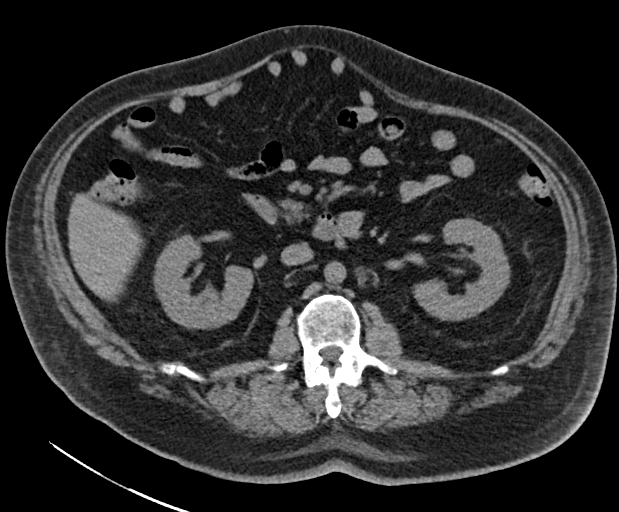
[im 75/106  soft-tissue]
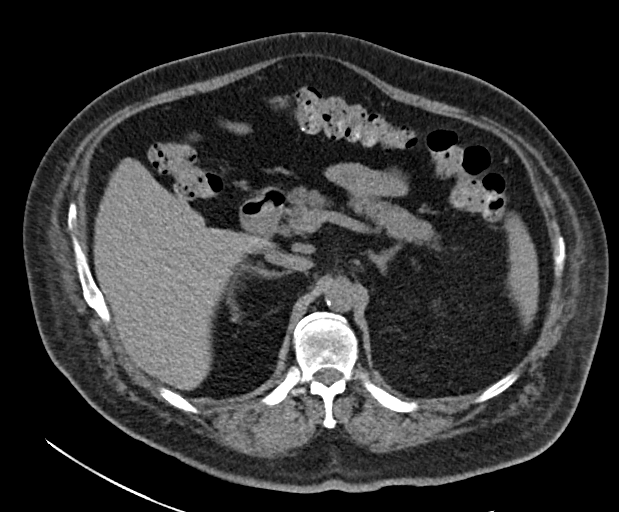
[im 89/106  soft-tissue]
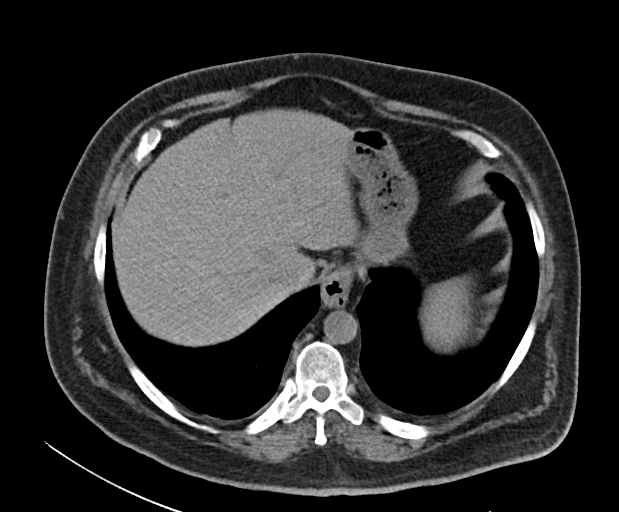
[im 99/106  soft-tissue]
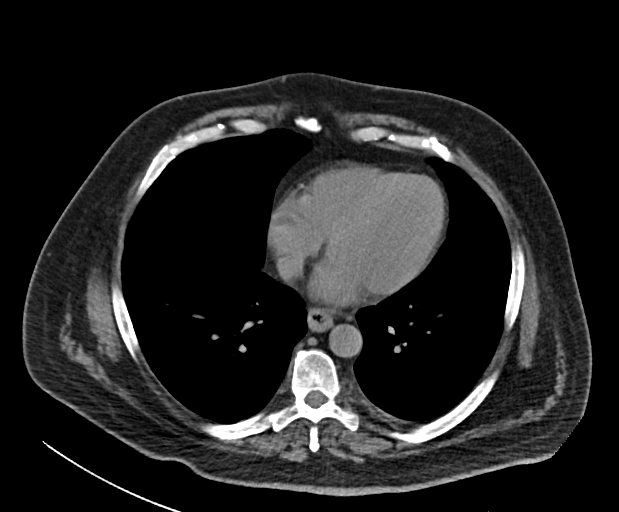
[im 99/106  bone]
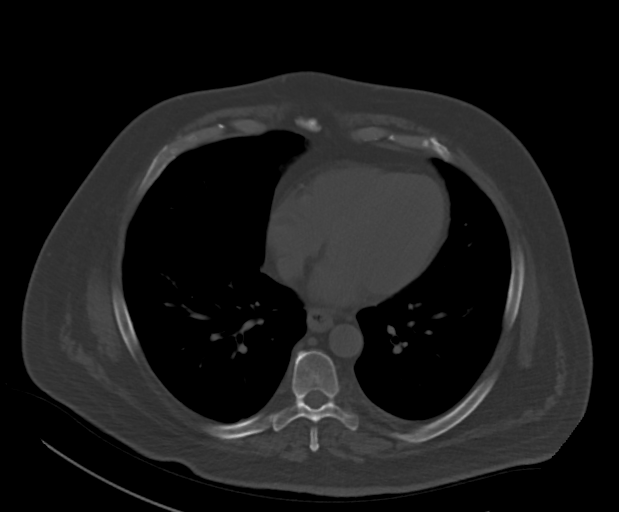

[Series 17: abdomen cor 5.00 br40 s3 · coronal · 0.91mm/px · 3 of 76 slices shown]
[im 26/76  soft-tissue]
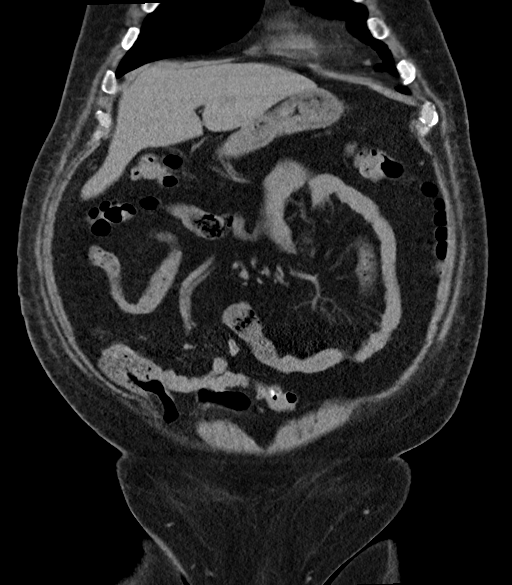
[im 34/76  soft-tissue]
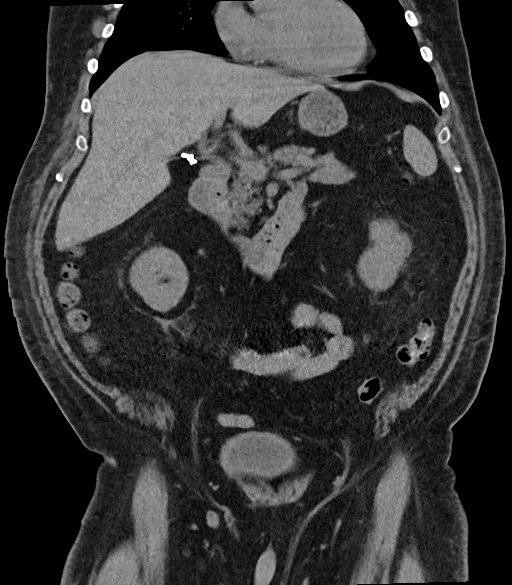
[im 42/76  soft-tissue]
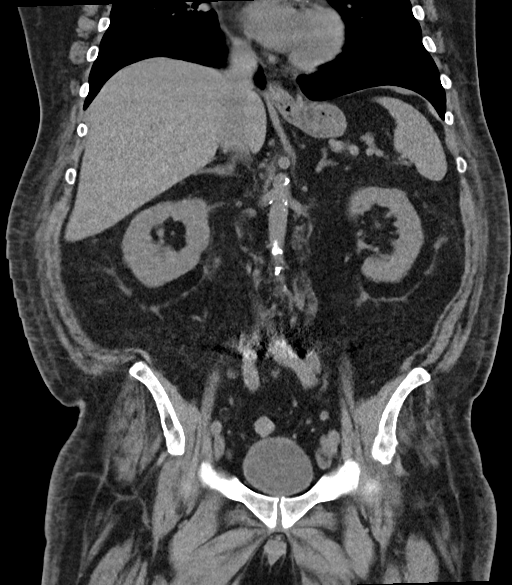

[12 of 46 positions shown; findings below may reference images not displayed]

All CT scans at this facility use dose modulation, interval reconstruction, and/or weight-based
dosing when appropriate to reduce radiation dose to as low as reasonably achievable.

Number of previous computed tomography exams in the last 12 months is 1 .

Number of previous nuclear medicine myocardial perfusion studies in the last 12 months is 0 .
FINDINGS: LUNGS:  The lungs demonstrate NO consolidations or significant effusions.Minimal fibrotic appearing
changes in the lung bases.

HEART:  Calcification is present within the visualized coronary arteries. Limited/incomplete
evaluation.

MEDIASTINUM:  NO significantly/pathologically enlarged lymph nodes.

Minimal hiatal hernia.

LIVER:  NO mass or abnormal enlargement.

GALLBLADDER AND BILE DUCTS:  Postsurgical changes from prior cholecystectomy.

PANCREAS:  NO evidence of edema/inflammatory changes. NO definite mass. Small complex-appearing air
collection in the area of the pancreas consistent with a duodenal diverticulum.

SPLEEN:  NO mass or abnormal enlargement.

ADRENALS:  NO suspicious masses or abnormal enlargement.

KIDNEYS AND URETERS:  NO hydronephrosis, obstructing stones or masses on the RIGHT.

NO hydronephrosis, obstructing stones or masses on the LEFT.

URETERS: NO definite ureteral stones.

STOMACH AND BOWEL:  STOMACH: NO abnormal thickening or distention.

SMALL BOWEL AND COLON: NO abnormal thickening or distention of the small bowel.

NO abnormal thickening or distention of the colon. Minimal diverticulosis. NO evidence of
diverticulitis.

The colon is moderately fecal filled. No significant distention.

APPENDIX:  The appendix is not identified.

BLADDER:  NO significant wall thickening or stones.

PELVIS: NO definite evidence of pelvic masses.

INTRAPERITONEAL SPACE:  No free air.  No significant fluid collection.

BONES/JOINTS:  NO definite acute compression or posterior element fractures in the thoracic spine.
Minimal degenerative changes.

NO definite acute compression or posterior element fractures in the lumbar spine.

Remote appearing compression fracture in the superior vertebral endplate at L1.

Pedicle screws and rods  at the L5 and S1 levels. NO change from prior study.

Nondisplaced fractures in the anterior RIGHT sixth and possibly fifth ribs. Old fractures in the
inferior lateral RIGHT ribs.

SOFT TISSUES:  Soft tissues demonstrate NO fluid collections or foreign body.

Area(s) of increased attenuation in the subcutaneous tissues. The differential includes edema,
infection and/or inflammatory changes and iatrogenic changes and associated with recent injection
in the anterior subcutaneous soft tissue. NO change from prior study.

Mild subcutaneous soft tissue increased attenuation in the mid lateral and flank areas of the
abdominal subcutaneous soft tissue.

VASCULATURE:  AORTA: The thoracic aorta demonstrates no evidence of aneurysm or rupture as
visualized.  Minimal vascular calcification.

PULMONARY ARTERIES: Noncontrast study of the pulmonary artery is grossly unremarkable. Embolus NOT
evaluated. .

AORTA: The abdominal aorta demonstrates no evidence of aneurysm or rupture as visualized.  Mild
vascular calcification.

LYMPH NODES:  Nonspecific lymph node is present on the LEFT periaortic area.

Stable moderate to large fat replaced lymph nodes in the pelvis.
IMPRESSION: - Nondisplaced fractures in the anterior RIGHT sixth and possibly fifth ribs. Old fractures in the
inferior lateral RIGHT ribs.

- Mild subcutaneous soft tissue increased attenuation in the mid lateral and flank areas of the
abdominal subcutaneous soft tissue.  This suggests edema. NO change from prior study.

-  NO pneumonia and/or significant atelectasis.

- NO bowel or renal obstruction or diverticulitis.

- Calcification is present within the visualized coronary arteries. Limited/incomplete evaluation.
This may have implications for possible adverse cardiac events. Further evaluation is
recommended/indicated.

- Stable moderate to large fat replaced lymph nodes in the pelvis.  This is of uncertain etiology.

- The colon is moderately fecal filled. No significant distention.

- Chronic/incidental findings as described.

Tech Notes:

Fall. Trauma. Pt c/o left sided pain. CT/NM 1/0. CF

## 2021-09-28 DEATH — deceased

## 2021-09-30 ENCOUNTER — Encounter: Admit: 2021-09-30 | Discharge: 2021-09-30 | Payer: MEDICARE
